# Patient Record
Sex: Male | Born: 1985 | Hispanic: Yes | Marital: Single | Smoking: Former smoker
Health system: Southern US, Community
[De-identification: ages and names within clinical notes are randomized; demographics above are authoritative.]

## PROBLEM LIST (undated history)

## (undated) ENCOUNTER — Ambulatory Visit (HOSPITAL_COMMUNITY): Admission: EM | Payer: 59

## (undated) DIAGNOSIS — E079 Disorder of thyroid, unspecified: Secondary | ICD-10-CM

## (undated) DIAGNOSIS — F32A Depression, unspecified: Secondary | ICD-10-CM

## (undated) DIAGNOSIS — I82409 Acute embolism and thrombosis of unspecified deep veins of unspecified lower extremity: Secondary | ICD-10-CM

## (undated) DIAGNOSIS — K589 Irritable bowel syndrome without diarrhea: Secondary | ICD-10-CM

## (undated) DIAGNOSIS — L97909 Non-pressure chronic ulcer of unspecified part of unspecified lower leg with unspecified severity: Secondary | ICD-10-CM

## (undated) DIAGNOSIS — K5900 Constipation, unspecified: Secondary | ICD-10-CM

## (undated) DIAGNOSIS — E291 Testicular hypofunction: Secondary | ICD-10-CM

## (undated) DIAGNOSIS — K769 Liver disease, unspecified: Secondary | ICD-10-CM

## (undated) DIAGNOSIS — I639 Cerebral infarction, unspecified: Secondary | ICD-10-CM

## (undated) DIAGNOSIS — N329 Bladder disorder, unspecified: Secondary | ICD-10-CM

## (undated) DIAGNOSIS — Z992 Dependence on renal dialysis: Secondary | ICD-10-CM

## (undated) DIAGNOSIS — M797 Fibromyalgia: Secondary | ICD-10-CM

## (undated) DIAGNOSIS — L989 Disorder of the skin and subcutaneous tissue, unspecified: Secondary | ICD-10-CM

## (undated) DIAGNOSIS — M810 Age-related osteoporosis without current pathological fracture: Secondary | ICD-10-CM

## (undated) DIAGNOSIS — F988 Other specified behavioral and emotional disorders with onset usually occurring in childhood and adolescence: Secondary | ICD-10-CM

## (undated) DIAGNOSIS — I10 Essential (primary) hypertension: Secondary | ICD-10-CM

## (undated) DIAGNOSIS — C801 Malignant (primary) neoplasm, unspecified: Secondary | ICD-10-CM

## (undated) DIAGNOSIS — R194 Change in bowel habit: Secondary | ICD-10-CM

## (undated) DIAGNOSIS — T7840XA Allergy, unspecified, initial encounter: Secondary | ICD-10-CM

## (undated) DIAGNOSIS — R569 Unspecified convulsions: Secondary | ICD-10-CM

## (undated) DIAGNOSIS — M2550 Pain in unspecified joint: Secondary | ICD-10-CM

## (undated) DIAGNOSIS — D699 Hemorrhagic condition, unspecified: Secondary | ICD-10-CM

## (undated) DIAGNOSIS — N529 Male erectile dysfunction, unspecified: Secondary | ICD-10-CM

## (undated) DIAGNOSIS — F419 Anxiety disorder, unspecified: Secondary | ICD-10-CM

## (undated) DIAGNOSIS — K219 Gastro-esophageal reflux disease without esophagitis: Secondary | ICD-10-CM

## (undated) DIAGNOSIS — K625 Hemorrhage of anus and rectum: Secondary | ICD-10-CM

## (undated) DIAGNOSIS — I519 Heart disease, unspecified: Secondary | ICD-10-CM

## (undated) DIAGNOSIS — J449 Chronic obstructive pulmonary disease, unspecified: Secondary | ICD-10-CM

## (undated) DIAGNOSIS — N200 Calculus of kidney: Secondary | ICD-10-CM

## (undated) DIAGNOSIS — E7800 Pure hypercholesterolemia, unspecified: Secondary | ICD-10-CM

## (undated) DIAGNOSIS — E669 Obesity, unspecified: Secondary | ICD-10-CM

## (undated) DIAGNOSIS — G4752 REM sleep behavior disorder: Secondary | ICD-10-CM

## (undated) DIAGNOSIS — M109 Gout, unspecified: Secondary | ICD-10-CM

## (undated) DIAGNOSIS — M1990 Unspecified osteoarthritis, unspecified site: Secondary | ICD-10-CM

## (undated) DIAGNOSIS — A159 Respiratory tuberculosis unspecified: Secondary | ICD-10-CM

## (undated) DIAGNOSIS — I2510 Atherosclerotic heart disease of native coronary artery without angina pectoris: Secondary | ICD-10-CM

## (undated) DIAGNOSIS — Z950 Presence of cardiac pacemaker: Secondary | ICD-10-CM

## (undated) DIAGNOSIS — E0590 Thyrotoxicosis, unspecified without thyrotoxic crisis or storm: Secondary | ICD-10-CM

## (undated) DIAGNOSIS — I219 Acute myocardial infarction, unspecified: Secondary | ICD-10-CM

## (undated) DIAGNOSIS — H9390 Unspecified disorder of ear, unspecified ear: Secondary | ICD-10-CM

## (undated) DIAGNOSIS — I2699 Other pulmonary embolism without acute cor pulmonale: Secondary | ICD-10-CM

## (undated) DIAGNOSIS — R1013 Epigastric pain: Secondary | ICD-10-CM

## (undated) DIAGNOSIS — G4733 Obstructive sleep apnea (adult) (pediatric): Secondary | ICD-10-CM

## (undated) DIAGNOSIS — K5792 Diverticulitis of intestine, part unspecified, without perforation or abscess without bleeding: Secondary | ICD-10-CM

## (undated) DIAGNOSIS — D649 Anemia, unspecified: Secondary | ICD-10-CM

## (undated) DIAGNOSIS — Q899 Congenital malformation, unspecified: Secondary | ICD-10-CM

## (undated) DIAGNOSIS — J45909 Unspecified asthma, uncomplicated: Secondary | ICD-10-CM

## (undated) DIAGNOSIS — E119 Type 2 diabetes mellitus without complications: Secondary | ICD-10-CM

## (undated) DIAGNOSIS — L309 Dermatitis, unspecified: Secondary | ICD-10-CM

## (undated) DIAGNOSIS — E039 Hypothyroidism, unspecified: Secondary | ICD-10-CM

## (undated) DIAGNOSIS — N289 Disorder of kidney and ureter, unspecified: Secondary | ICD-10-CM

## (undated) HISTORY — DX: Respiratory tuberculosis unspecified: A15.9

## (undated) HISTORY — DX: Disorder of kidney and ureter, unspecified: N28.9

## (undated) HISTORY — DX: Pure hypercholesterolemia, unspecified: E78.00

## (undated) HISTORY — DX: Disorder of thyroid, unspecified: E07.9

## (undated) HISTORY — DX: Other specified behavioral and emotional disorders with onset usually occurring in childhood and adolescence: F98.8

## (undated) HISTORY — DX: Anemia, unspecified: D64.9

## (undated) HISTORY — DX: Non-pressure chronic ulcer of unspecified part of unspecified lower leg with unspecified severity: L97.909

## (undated) HISTORY — DX: Diverticulitis of intestine, part unspecified, without perforation or abscess without bleeding: K57.92

## (undated) HISTORY — DX: Age-related osteoporosis without current pathological fracture: M81.0

## (undated) HISTORY — DX: Bladder disorder, unspecified: N32.9

## (undated) HISTORY — DX: Constipation, unspecified: K59.00

## (undated) HISTORY — DX: Epigastric pain: R10.13

## (undated) HISTORY — PX: INSERT / REPLACE / REMOVE PACEMAKER: SUR710

## (undated) HISTORY — DX: Malignant (primary) neoplasm, unspecified: C80.1

## (undated) HISTORY — DX: Unspecified disorder of ear, unspecified ear: H93.90

## (undated) HISTORY — DX: Gastro-esophageal reflux disease without esophagitis: K21.9

## (undated) HISTORY — DX: Allergy, unspecified, initial encounter: T78.40XA

## (undated) HISTORY — DX: Dermatitis, unspecified: L30.9

## (undated) HISTORY — DX: Thyrotoxicosis, unspecified without thyrotoxic crisis or storm: E05.90

## (undated) HISTORY — DX: Hemorrhage of anus and rectum: K62.5

## (undated) HISTORY — DX: Gout, unspecified: M10.9

## (undated) HISTORY — DX: Type 2 diabetes mellitus without complications: E11.9

## (undated) HISTORY — DX: Other pulmonary embolism without acute cor pulmonale: I26.99

## (undated) HISTORY — DX: Acute myocardial infarction, unspecified: I21.9

## (undated) HISTORY — DX: Atherosclerotic heart disease of native coronary artery without angina pectoris: I25.10

## (undated) HISTORY — DX: Irritable bowel syndrome, unspecified: K58.9

## (undated) HISTORY — DX: Calculus of kidney: N20.0

## (undated) HISTORY — DX: Unspecified convulsions: R56.9

## (undated) HISTORY — DX: Disorder of the skin and subcutaneous tissue, unspecified: L98.9

## (undated) HISTORY — DX: Essential (primary) hypertension: I10

## (undated) HISTORY — DX: Depression, unspecified: F32.A

## (undated) HISTORY — DX: Heart disease, unspecified: I51.9

## (undated) HISTORY — DX: Change in bowel habit: R19.4

## (undated) HISTORY — DX: Liver disease, unspecified: K76.9

## (undated) HISTORY — DX: Fibromyalgia: M79.7

## (undated) HISTORY — DX: Congenital malformation, unspecified: Q89.9

## (undated) HISTORY — DX: REM sleep behavior disorder: G47.52

## (undated) HISTORY — DX: Male erectile dysfunction, unspecified: N52.9

## (undated) HISTORY — DX: Hypothyroidism, unspecified: E03.9

## (undated) HISTORY — DX: Presence of cardiac pacemaker: Z95.0

## (undated) HISTORY — DX: Dependence on renal dialysis: Z99.2

## (undated) HISTORY — DX: Anxiety disorder, unspecified: F41.9

## (undated) HISTORY — DX: Obesity, unspecified: E66.9

## (undated) HISTORY — DX: Testicular hypofunction: E29.1

## (undated) HISTORY — DX: Obstructive sleep apnea (adult) (pediatric): G47.33

## (undated) HISTORY — DX: Hemorrhagic condition, unspecified: D69.9

## (undated) HISTORY — DX: Pain in unspecified joint: M25.50

## (undated) HISTORY — DX: Unspecified asthma, uncomplicated: J45.909

## (undated) HISTORY — DX: Cerebral infarction, unspecified: I63.9

## (undated) HISTORY — DX: Unspecified osteoarthritis, unspecified site: M19.90

## (undated) HISTORY — DX: Chronic obstructive pulmonary disease, unspecified: J44.9

## (undated) HISTORY — DX: Acute embolism and thrombosis of unspecified deep veins of unspecified lower extremity: I82.409

---

## 2010-03-12 HISTORY — PX: CHOLECYSTECTOMY: SHX55

## 2017-03-08 DIAGNOSIS — I10 Essential (primary) hypertension: Secondary | ICD-10-CM | POA: Insufficient documentation

## 2017-07-23 DIAGNOSIS — G4733 Obstructive sleep apnea (adult) (pediatric): Secondary | ICD-10-CM | POA: Insufficient documentation

## 2017-10-01 DIAGNOSIS — L304 Erythema intertrigo: Secondary | ICD-10-CM | POA: Insufficient documentation

## 2017-10-01 HISTORY — DX: Erythema intertrigo: L30.4

## 2017-10-20 NOTE — Progress Notes (Deleted)
   Subjective:    Patient ID: Leslie Andrea, male    DOB: 1985/05/19, 32 y.o.   MRN: [id number]  HPI:  Mr. Milano is here to establish as a new pt.  He is a pleasant 32 year old male. PMH:   Patient Care Team    Relationship Specialty Notifications Start End  Galateo, Berna Spare, NP PCP - General Family Medicine  09/24/17     There are no active problems to display for this patient.    No past medical history on file.   *** The histories are not reviewed yet. Please review them in the "History" navigator section and refresh this [place].   No family history on file.   Social History   Substance and Sexual Activity  Drug Use Not on file     Social History   Substance and Sexual Activity  Alcohol Use Not on file     Social History   Tobacco Use  Smoking Status Not on file     No outpatient encounter medications on file as of 10/21/2017.   No facility-administered encounter medications on file as of 10/21/2017.     Allergies: Patient has no allergy information on record.  There is no height or weight on file to calculate BMI.  There were no vitals taken for this visit.     Review of Systems     Objective:   Physical Exam        Assessment & Plan:  No diagnosis found.  No problem-specific Assessment & Plan notes found for this encounter.    FOLLOW-UP:  No follow-ups on file.

## 2017-10-21 ENCOUNTER — Ambulatory Visit: Payer: Self-pay | Admitting: Adult Health

## 2018-02-03 ENCOUNTER — Other Ambulatory Visit: Payer: Self-pay | Admitting: Nurse Practitioner

## 2018-02-03 ENCOUNTER — Ambulatory Visit
Admission: RE | Admit: 2018-02-03 | Discharge: 2018-02-03 | Disposition: A | Discharge status: Home / Self Care | Payer: No Typology Code available for payment source | Source: Ambulatory Visit | Attending: Nurse Practitioner | Admitting: Nurse Practitioner

## 2018-02-03 DIAGNOSIS — M5489 Other dorsalgia: Secondary | ICD-10-CM

## 2018-02-03 DIAGNOSIS — R5383 Other fatigue: Secondary | ICD-10-CM

## 2018-04-11 ENCOUNTER — Other Ambulatory Visit: Payer: Self-pay | Admitting: Nurse Practitioner

## 2018-04-11 DIAGNOSIS — M545 Low back pain, unspecified: Secondary | ICD-10-CM

## 2018-04-28 ENCOUNTER — Ambulatory Visit
Admission: RE | Admit: 2018-04-28 | Discharge: 2018-04-28 | Disposition: A | Discharge status: Home / Self Care | Payer: Self-pay | Source: Ambulatory Visit | Attending: Nurse Practitioner | Admitting: Nurse Practitioner

## 2018-04-28 DIAGNOSIS — M545 Low back pain, unspecified: Secondary | ICD-10-CM

## 2018-11-21 DIAGNOSIS — F419 Anxiety disorder, unspecified: Secondary | ICD-10-CM | POA: Insufficient documentation

## 2018-11-21 HISTORY — DX: Anxiety disorder, unspecified: F41.9

## 2019-06-03 DIAGNOSIS — F418 Other specified anxiety disorders: Secondary | ICD-10-CM | POA: Insufficient documentation

## 2019-06-03 HISTORY — DX: Other specified anxiety disorders: F41.8

## 2019-12-04 ENCOUNTER — Telehealth: Payer: Self-pay

## 2019-12-04 NOTE — Telephone Encounter (Signed)
NOTES ON FILE FROM Evalyn Casco NP [phone], SENT REFERRAL TO SCHEDULING

## 2019-12-10 NOTE — Telephone Encounter (Signed)
Received phone call from pt to schedule appt. Have not received new patient referral yet from [facility]

## 2019-12-31 ENCOUNTER — Other Ambulatory Visit: Payer: Self-pay

## 2019-12-31 ENCOUNTER — Telehealth: Payer: Self-pay | Admitting: Radiology

## 2019-12-31 ENCOUNTER — Ambulatory Visit (INDEPENDENT_AMBULATORY_CARE_PROVIDER_SITE_OTHER): Payer: Self-pay | Admitting: Internal Medicine

## 2019-12-31 ENCOUNTER — Encounter: Payer: Self-pay | Admitting: Internal Medicine

## 2019-12-31 VITALS — BP 118/62 | HR 76 | Ht 68.0 in | Wt >= 6400 oz

## 2019-12-31 DIAGNOSIS — E785 Hyperlipidemia, unspecified: Secondary | ICD-10-CM | POA: Insufficient documentation

## 2019-12-31 DIAGNOSIS — E7849 Other hyperlipidemia: Secondary | ICD-10-CM | POA: Insufficient documentation

## 2019-12-31 DIAGNOSIS — Z720 Tobacco use: Secondary | ICD-10-CM

## 2019-12-31 DIAGNOSIS — R002 Palpitations: Secondary | ICD-10-CM | POA: Insufficient documentation

## 2019-12-31 HISTORY — DX: Tobacco use: Z72.0

## 2019-12-31 NOTE — Patient Instructions (Signed)
Medication Instructions:  Your physician recommends that you continue on your current medications as directed. Please refer to the Current Medication list given to you today.  *If you need a refill on your cardiac medications before your next appointment, please call your pharmacy*   Lab Work: none If you have labs (blood work) drawn today and your tests are completely normal, you will receive your results only by: Marland Kitchen MyChart Message (if you have MyChart) OR . A paper copy in the mail If you have any lab test that is abnormal or we need to change your treatment, we will call you to review the results.   Testing/Procedures: Your physician has recommended that you wear a holter monitor. Holter monitors are medical devices that record the heart's electrical activity. Doctors most often use these monitors to diagnose arrhythmias. Arrhythmias are problems with the speed or rhythm of the heartbeat. The monitor is a small, portable device. You can wear one while you do your normal daily activities. This is usually used to diagnose what is causing palpitations/syncope (passing out).  14 day zio     Follow-Up: At [facility], you and your health needs are our priority.  As part of our continuing mission to provide you with exceptional heart care, we have created designated Provider Care Teams.  These Care Teams include your primary Cardiologist (physician) and Advanced Practice Providers (APPs -  Physician Assistants and Nurse Practitioners) who all work together to provide you with the care you need, when you need it.  We recommend signing up for the patient portal called "MyChart".  Sign up information is provided on this After Visit Summary.  MyChart is used to connect with patients for Virtual Visits (Telemedicine).  Patients are able to view lab/test results, encounter notes, upcoming appointments, etc.  Non-urgent messages can be sent to your provider as well.   To learn more about what you  can do with MyChart, go to [email or web address].    Your next appointment:   3-4} month(s)   The format for your next appointment:   In Person  Provider:   You may see Werner Lean, MD or one of the following Advanced Practice Providers on your designated Care Team:    Melina Copa, PA-C  Ermalinda Barrios, PA-C    Other Instructions  Bryn Gulling- Long Term Monitor Instructions   Your physician has requested you wear your ZIO patch monitor_______days.   This is a single patch monitor.  [employer] supplies one patch monitor per enrollment.  Additional stickers are not available.   Please do not apply patch if you will be having a Nuclear Stress Test, Echocardiogram, Cardiac CT, MRI, or Chest Xray during the time frame you would be wearing the monitor. The patch cannot be worn during these tests.  You cannot remove and re-apply the ZIO XT patch monitor.   Your ZIO patch monitor will be sent USPS Priority mail from [facility] directly to your home address. The monitor may also be mailed to a PO BOX if home delivery is not available.   It may take 3-5 days to receive your monitor after you have been enrolled.   Once you have received you monitor, please review enclosed instructions.  Your monitor has already been registered assigning a specific monitor serial # to you.   Applying the monitor   Shave hair from upper left chest.   Hold abrader disc by orange tab.  Rub abrader in 40 strokes over left upper chest as  indicated in your monitor instructions.   Clean area with 4 enclosed alcohol pads .  Use all pads to assure are is cleaned thoroughly.  Let dry.   Apply patch as indicated in monitor instructions.  Patch will be place under collarbone on left side of chest with arrow pointing upward.   Rub patch adhesive wings for 2 minutes.Remove white label marked "1".  Remove white label marked "2".  Rub patch adhesive wings for 2 additional minutes.   While looking in a  mirror, press and release button in center of patch.  A small green light will flash 3-4 times .  This will be your only indicator the monitor has been turned on.     Do not shower for the first 24 hours.  You may shower after the first 24 hours.   Press button if you feel a symptom. You will hear a small click.  Record Date, Time and Symptom in the Patient Log Book.   When you are ready to remove patch, follow instructions on last 2 pages of Patient Log Book.  Stick patch monitor onto last page of Patient Log Book.   Place Patient Log Book in [place] box.  Use locking tab on box and tape box closed securely.  The [employer] and [employer] has [employer] on it.  Please place in mailbox as soon as possible.  Your physician should have your test results approximately 7 days after the monitor has been mailed back to [facility].   Call [place] at [phone] if you have questions regarding your ZIO XT patch monitor.  Call them immediately if you see an orange light blinking on your monitor.   If your monitor falls off in less than 4 days contact our Monitor department at [phone].  If your monitor becomes loose or falls off after 4 days call [employer] at [phone] for suggestions on securing your monitor.

## 2019-12-31 NOTE — Telephone Encounter (Signed)
Enrolled patient for a 14 day Zio XT Monitor to be mailed to patients home  

## 2019-12-31 NOTE — Progress Notes (Signed)
Cardiology Office Note:    Date:  12/31/2019   ID:  Eddie Murray, DOB 06/21/1985, MRN [id number]  Referring MD: Finis Bud, NP   CC: weird symptoms with GERD Consulted for the evaluation of palpitations at the behest of [place], [place], [place]  History of Present Illness:    Eddie Murray is a 34 y.o. male with a hx of HTN, GERD, Tobacco Abuse, HTN, HLD Morbid obesity, OSA, . who presents for palpitations.  Saw his PCP with occasional palpitations, and shortness of breath with sitting doewn.  Patient notes that he went down to [place] in the setting of some family problems.  Notes that he also had changed SSRIs.  Felt palpitations at rest.  Felt his heart beat more.  No chest pain.  No syncope, no near syncope.  Last time he felt the palpitations between 8 am and noon.  Has it 5/7 days.  Only occurs when sitting.  Not associated standing.  Notes that he has been having very difficult to control GERD and had recent endoscopy.  Thinks he may have esophageal spasm.  Has feeling of food getting stuck.  Had a stress test done in 2011 that was normal.  Past Medical History:  Diagnosis Date  . ADD (attention deficit disorder)   . Allergies   . Anemia   . Anxiety disorder   . Arthritis   . Asthma   . Bladder problem   . Bleeding disorder ([place])   . CAD (coronary artery disease)   . Cancer ([place])   . Congenital abnormalities   . Constipation   . COPD (chronic obstructive pulmonary disease) ([place])   . Depression   . Diabetes ([place])   . Dialysis patient ([place])   . Diverticulitis   . DVT (deep venous thrombosis) ([place])   . Ear problems   . Eczema   . ED (erectile dysfunction)   . Fibromyalgia   . GERD (gastroesophageal reflux disease)   . Gout   . Heart attack ([place])   . Heart disease   . High cholesterol   . Hypertension   . Hyperthyroidism   . Hypogonadism in male   . Hypothyroidism   . Joint pain   . Kidney disease   . Kidney stone   . Leg ulcer ([place])   . Liver  disease   . Obesity   . OSA (obstructive sleep apnea)   . Osteoporosis   . Pacemaker   . Pulmonary embolism ([place])   . REM behavioral disorder   . Seizures ([place])   . Skin disorder   . Stroke ([place])   . Thyroid disorder   . Tuberculosis     Past Surgical History:  Procedure Laterality Date  . INSERT / REPLACE / REMOVE PACEMAKER      Current Medications: Current Meds  Medication Sig  . amLODipine (NORVASC) 10 MG tablet Take 10 mg by mouth daily.  Marland Kitchen buPROPion (WELLBUTRIN SR) 150 MG 12 hr tablet Take 150 mg by mouth daily.  . cetirizine (ZYRTEC) 10 MG tablet Take 10 mg by mouth as needed for allergies.   . clonazePAM (KLONOPIN) 0.5 MG tablet Take 0.5 mg by mouth daily as needed for anxiety.  Marland Kitchen GARLIC PO Take by mouth.  Marland Kitchen ibuprofen (ADVIL) 600 MG tablet Take 600 mg by mouth every 6 (six) hours as needed.  Marland Kitchen omeprazole (PRILOSEC) 40 MG capsule Take 40 mg by mouth daily.    Allergies:   Lisinopril   Social History  Socioeconomic History  . Marital status: Single    Spouse name: Not on file  . Number of children: Not on file  . Years of education: Not on file  . Highest education level: Not on file  Occupational History  . Not on file  Tobacco Use  . Smoking status: Former Smoker    Types: Cigarettes, Cigars    Quit date: 2017    Years since quitting: 4.8  . Smokeless tobacco: Never Used  Substance and Sexual Activity  . Alcohol use: Not on file  . Drug use: Not on file  . Sexual activity: Not on file  Other Topics Concern  . Not on file  Social History Narrative  . Not on file   Social Determinants of Health   Financial Resource Strain:   . Difficulty of Paying Living Expenses: Not on file  Food Insecurity:   . Worried About Charity fundraiser in the Last Year: Not on file  . Ran Out of Food in the Last Year: Not on file  Transportation Needs:   . Lack of Transportation (Medical): Not on file  . Lack of Transportation (Non-Medical): Not on file  Physical  Activity:   . Days of Exercise per Week: Not on file  . Minutes of Exercise per Session: Not on file  Stress:   . Feeling of Stress : Not on file  Social Connections:   . Frequency of Communication with Friends and Family: Not on file  . Frequency of Social Gatherings with Friends and Family: Not on file  . Attends Religious Services: Not on file  . Active Member of Clubs or Organizations: Not on file  . Attends Archivist Meetings: Not on file  . Marital Status: Not on file    Family History: The patient's family history includes Diabetes in his father; Hypertension in his father and mother; Other in his maternal grandfather; Thyroid disease in his maternal grandfather and mother. No family history of early CAD  ROS:   Please see the history of present illness.    All other systems reviewed and are negative.  EKGs/Labs/Other Studies Reviewed:    The following studies were reviewed today:  EKG:  EKG is ordered today.  The ekg ordered today demonstrates a regular rhythm; likely sinus with low voltage p waves, rate 75 no ST/T changes   Physical Exam:    VS:  BP 118/62   Pulse 76   Ht 5\' 8"  (1.727 m)   Wt (!) 418 lb (189.6 kg)   SpO2 97%   BMI 63.56 kg/m     Wt Readings from Last 3 Encounters:  12/31/19 (!) 418 lb (189.6 kg)    GEN: Morbidly Obese well developed in no acute distress HEENT: Normal NECK: No JVD; No carotid bruits LYMPHATICS: No lymphadenopathy CARDIAC: RRR, no murmurs, rubs, gallops RESPIRATORY:  Clear to auscultation without rales, wheezing or rhonchi  ABDOMEN: Soft, non-tender, non-distended MUSCULOSKELETAL:  No edema; No deformity  SKIN: Warm and dry NEUROLOGIC:  Alert and oriented x 3 PSYCHIATRIC:  Normal affect   ASSESSMENT:    No diagnosis found.  PLAN:    In order of problems listed above:  Palpitations - in the setting of Morbid Obesity HTN OSA - Will get 14 day non-live Ziopatch.  HLD - patient deferred further eval at  this time; does not have insurance and would like to address with PCP  Tobacco Abuse- occasional cigars; on buproprion with GERD 1. The patient was counseled  on the dangers of tobacco use, both inhaled and oral, which include, but are not limited to cardiovascular disease, increased cancer risk of multiple types of cancer, COPD, peripheral vascular disease, strokes. 2. He was also counseled on the benefits of smoking cessation. 3. The patient was firmly advised to quit.    4. We also reviewed strategies to maximize success, including:  Removing cigarettes and smoking materials from environment  Stress management  Substitution of other forms of reinforcement Support of family/friends.  Selecting a quit date.  Patient provided contact information for 1-800-QUIT-NOW      3-4 month follow up after testing unless new symptoms or abnormal test results warranting change in plan  Would be reasonable for Virtual Follow up  Would be reasonable for APP Follow up   Medication Adjustments/Labs and Tests Ordered: Current medicines are reviewed at length with the patient today.  Concerns regarding medicines are outlined above.  No orders of the defined types were placed in this encounter.  No orders of the defined types were placed in this encounter.   There are no Patient Instructions on file for this visit.   Signed, Werner Lean, MD  12/31/2019 8:59 AM    [facility]

## 2020-01-04 ENCOUNTER — Other Ambulatory Visit (INDEPENDENT_AMBULATORY_CARE_PROVIDER_SITE_OTHER): Payer: Self-pay

## 2020-01-04 DIAGNOSIS — R002 Palpitations: Secondary | ICD-10-CM

## 2020-01-13 DIAGNOSIS — K0510 Chronic gingivitis, plaque induced: Secondary | ICD-10-CM

## 2020-01-13 DIAGNOSIS — K219 Gastro-esophageal reflux disease without esophagitis: Secondary | ICD-10-CM

## 2020-01-13 HISTORY — DX: Gastro-esophageal reflux disease without esophagitis: K21.9

## 2020-01-13 HISTORY — DX: Chronic gingivitis, plaque induced: K05.10

## 2020-04-04 ENCOUNTER — Encounter: Payer: Self-pay | Admitting: Internal Medicine

## 2020-04-04 ENCOUNTER — Other Ambulatory Visit: Payer: Self-pay

## 2020-04-04 ENCOUNTER — Ambulatory Visit (INDEPENDENT_AMBULATORY_CARE_PROVIDER_SITE_OTHER): Payer: 59 | Admitting: Internal Medicine

## 2020-04-04 VITALS — BP 118/80 | HR 80 | Ht 68.0 in | Wt >= 6400 oz

## 2020-04-04 DIAGNOSIS — R0600 Dyspnea, unspecified: Secondary | ICD-10-CM

## 2020-04-04 DIAGNOSIS — R002 Palpitations: Secondary | ICD-10-CM

## 2020-04-04 DIAGNOSIS — R0609 Other forms of dyspnea: Secondary | ICD-10-CM | POA: Insufficient documentation

## 2020-04-04 DIAGNOSIS — E785 Hyperlipidemia, unspecified: Secondary | ICD-10-CM | POA: Diagnosis not present

## 2020-04-04 DIAGNOSIS — Z6841 Body Mass Index (BMI) 40.0 and over, adult: Secondary | ICD-10-CM | POA: Insufficient documentation

## 2020-04-04 DIAGNOSIS — R079 Chest pain, unspecified: Secondary | ICD-10-CM | POA: Diagnosis not present

## 2020-04-04 DIAGNOSIS — Z720 Tobacco use: Secondary | ICD-10-CM

## 2020-04-04 DIAGNOSIS — I10 Essential (primary) hypertension: Secondary | ICD-10-CM

## 2020-04-04 HISTORY — DX: Other forms of dyspnea: R06.09

## 2020-04-04 HISTORY — DX: Morbid (severe) obesity due to excess calories: E66.01

## 2020-04-04 HISTORY — DX: Chest pain, unspecified: R07.9

## 2020-04-04 NOTE — Patient Instructions (Addendum)
Medication Instructions:  Your physician recommends that you continue on your current medications as directed. Please refer to the Current Medication list given to you today.  *If you need a refill on your cardiac medications before your next appointment, please call your pharmacy*   Lab Work: None ordered   If you have labs (blood work) drawn today and your tests are completely normal, you will receive your results only by: Marland Kitchen MyChart Message (if you have MyChart) OR . A paper copy in the mail If you have any lab test that is abnormal or we need to change your treatment, we will call you to review the results.   Testing/Procedures: Your physician has requested that you have an echocardiogram. Echocardiography is a painless test that uses sound waves to create images of your heart. It provides your doctor with information about the size and shape of your heart and how well your heart's chambers and valves are working. This procedure takes approximately one hour. There are no restrictions for this procedure.   Follow-Up: At [facility], you and your health needs are our priority.  As part of our continuing mission to provide you with exceptional heart care, we have created designated Provider Care Teams.  These Care Teams include your primary Cardiologist (physician) and Advanced Practice Providers (APPs -  Physician Assistants and Nurse Practitioners) who all work together to provide you with the care you need, when you need it.  We recommend signing up for the patient portal called "MyChart".  Sign up information is provided on this After Visit Summary.  MyChart is used to connect with patients for Virtual Visits (Telemedicine).  Patients are able to view lab/test results, encounter notes, upcoming appointments, etc.  Non-urgent messages can be sent to your provider as well.   To learn more about what you can do with MyChart, go to [email or web address].    Your next appointment:   6  month(s)  The format for your next appointment:   In Person  Provider:   You may see Werner Lean, MD or one of the following Advanced Practice Providers on your designated Care Team:    Melina Copa, PA-C  Ermalinda Barrios, PA-C    Other Instructions Your physician has referred you to see [facility]

## 2020-04-04 NOTE — Progress Notes (Signed)
Cardiology Office Note:    Date:  04/04/2020   ID:  Eddie Murray, DOB [birth year], MRN [id number]  Referring MD: Chaney Malling, PA   CC: weird symptoms with GERD Consulted for the evaluation of palpitations at the [facility] [place], [place], [place]  History of Present Illness:    Eddie Murray is a 35 y.o. male with a hx of HTN, Morbid Obesity with HLD & OSA, GERD, Tobacco Abuse, SA, . who presented for palpitations 12/31/19.  Had event monitor that showed that most of his symptoms were associated with sinus rhythm.  Patient notes that he is doing [place].  Since last visit notes that he has tried to cut gluten down and notes some improvement.  Relevant interval testing or therapy include new muscle relaxant start for his arm pain (see below).  There are no interval hospital/ED visit.    No chest pain or pressure.  Has had new left arm pain:  Forearm pain that shoots up his arm.  This occurs constantly with no change with activity.  Can tell when he wakes up if its going to be a good or bad today.  Depends on sleeping position.  Notes that when his spells occur he has some shortness of breath and , if they occur when he is exerting himself, has DOE.  Still has palpitations.  Ambulatory blood pressure SBP 130-135.   Past Medical History:  Diagnosis Date  . ADD (attention deficit disorder)   . Allergies   . Anemia   . Anxiety disorder   . Arthritis   . Asthma   . Bladder problem   . Bleeding disorder ([place])   . CAD (coronary artery disease)   . Cancer ([place])   . Congenital abnormalities   . Constipation   . COPD (chronic obstructive pulmonary disease) ([place])   . Depression   . Diabetes ([place])   . Dialysis patient ([place])   . Diverticulitis   . DVT (deep venous thrombosis) ([place])   . Ear problems   . Eczema   . ED (erectile dysfunction)   . Fibromyalgia   . GERD (gastroesophageal reflux disease)   . Gout   . Heart attack ([place])   . Heart disease   . High cholesterol   . Hypertension    . Hyperthyroidism   . Hypogonadism in male   . Hypothyroidism   . Joint pain   . Kidney disease   . Kidney stone   . Leg ulcer ([place])   . Liver disease   . Obesity   . OSA (obstructive sleep apnea)   . Osteoporosis   . Pacemaker   . Pulmonary embolism ([place])   . REM behavioral disorder   . Seizures ([place])   . Skin disorder   . Stroke ([place])   . Thyroid disorder   . Tuberculosis     Past Surgical History:  Procedure Laterality Date  . INSERT / REPLACE / REMOVE PACEMAKER      Current Medications: Current Meds  Medication Sig  . amLODipine (NORVASC) 10 MG tablet Take 10 mg by mouth daily.  Marland Kitchen buPROPion (WELLBUTRIN SR) 150 MG 12 hr tablet Take 150 mg by mouth daily.  . cetirizine (ZYRTEC) 10 MG tablet Take 10 mg by mouth as needed for allergies.   . clonazePAM (KLONOPIN) 0.5 MG tablet Take 0.5 mg by mouth daily as needed for anxiety.  . cyclobenzaprine (FLEXERIL) 10 MG tablet Take 10 mg by mouth 3 (three) times daily.  Marland Kitchen GARLIC PO  Take by mouth as needed.  . hydrochlorothiazide (HYDRODIURIL) 12.5 MG tablet Take 12.5 mg by mouth 3 times/day as needed-between meals & bedtime (BP).  Marland Kitchen ibuprofen (ADVIL) 600 MG tablet Take 600 mg by mouth every 6 (six) hours as needed for headache or moderate pain.  Marland Kitchen omeprazole (PRILOSEC) 40 MG capsule Take 40 mg by mouth as needed (heartburn).    Allergies:   Lisinopril   Social History   Socioeconomic History  . Marital status: Single    Spouse name: Not on file  . Number of children: Not on file  . Years of education: Not on file  . Highest education level: Not on file  Occupational History  . Not on file  Tobacco Use  . Smoking status: Former Smoker    Types: Cigarettes, Cigars    Quit date: 2017    Years since quitting: 5.0  . Smokeless tobacco: Never Used  Substance and Sexual Activity  . Alcohol use: Not on file  . Drug use: Not on file  . Sexual activity: Not on file  Other Topics Concern  . Not on file  Social History  Narrative  . Not on file   Social Determinants of Health   Financial Resource Strain: Not on file  Food Insecurity: Not on file  Transportation Needs: Not on file  Physical Activity: Not on file  Stress: Not on file  Social Connections: Not on file    Family History: The patient's family history includes Diabetes in his father; Hypertension in his father and mother; Other in his maternal grandfather; Thyroid disease in his maternal grandfather and mother. No family history of early CAD  ROS:   Please see the history of present illness.    All other systems reviewed and are negative.  EKGs/Labs/Other Studies Reviewed:    The following studies were reviewed today:  EKG:   12/31/2019 SR with low voltage p waves, rate 75 no ST/T changes  Physical Exam:    VS:  BP 118/80   Pulse 80   Ht 5\' 8"  (1.727 m)   Wt (!) 416 lb 6.4 oz (188.9 kg)   SpO2 98%   BMI 63.31 kg/m     Wt Readings from Last 3 Encounters:  04/04/20 (!) 416 lb 6.4 oz (188.9 kg)  12/31/19 (!) 418 lb (189.6 kg)    GEN: Morbidly Obese well developed in no acute distress HEENT: Normal NECK: No JVD; No carotid bruits LYMPHATICS: No lymphadenopathy CARDIAC: RRR, no murmurs, rubs, gallops (distant heart sounds) RESPIRATORY:  Clear to auscultation without rales, wheezing or rhonchi  ABDOMEN: Soft, non-tender, non-distended MUSCULOSKELETAL:  No edema; No deformity  SKIN: Warm and dry NEUROLOGIC:  Alert and oriented x 3 PSYCHIATRIC:  Normal affect   ASSESSMENT:    1. Palpitations   2. Chest pain of uncertain etiology   3. DOE (dyspnea on exertion)   4. Hyperlipidemia, unspecified hyperlipidemia type   5. Essential hypertension   6. Tobacco abuse   7. Morbid obesity (HCC)     PLAN:    In order of problems listed above:  Palpitations PACs/PVCs - discussed the risks and benefits of AV nodal therapy, given that 140+ of his 150 triggers are not PVC related, will defer trial of propranolol 80 mg PO daily    DOE with chest discomfort - will get echocardiogram - this DOE occurs with certain foods and unrelieved by PPI; reasonable to refer to GI  Essential Hypertension  Morbid Obesity  ambulatory blood pressure SBP  130, will continue ambulatory BP monitoring; gave education on how to perform ambulatory blood pressure monitoring including the frequency and technique; goal ambulatory blood pressure < 135/85 on average - continue home medications (HCTZ and novasc) - discussed diet (DASH/low sodium), and exercise/weight loss interventions   HLD - patient deferred further eval at this time; does not have insurance and would like to address with PCP; this has not changed from prior  Tobacco Abuse- occasional cigars; on buproprion 1. The patient was counseled on the dangers of tobacco use, both inhaled and oral, which include, but are not limited to cardiovascular disease, increased cancer risk of multiple types of cancer, COPD, peripheral vascular disease, strokes. 2. He was also counseled on the benefits of smoking cessation. 3. The patient was firmly advised to quit.    4. We also reviewed strategies to maximize success, including:  Removing cigarettes and smoking materials from environment  Stress management  Substitution of other forms of reinforcement Support of family/friends.  Selecting a quit date.  Patient provided contact information for 1-800-QUIT-NOW   6 month follow up after testing unless new symptoms or abnormal test results warranting change in plan  Would be reasonable for Virtual Follow up Would be reasonable for APP Follow up   Medication Adjustments/Labs and Tests Ordered: Current medicines are reviewed at length with the patient today.  Concerns regarding medicines are outlined above.  Orders Placed This Encounter  Procedures  . Ambulatory referral to Gastroenterology  . ECHOCARDIOGRAM COMPLETE   No orders of the defined types were placed in this  encounter.   Patient Instructions  Medication Instructions:  Your physician recommends that you continue on your current medications as directed. Please refer to the Current Medication list given to you today.  *If you need a refill on your cardiac medications before your next appointment, please call your pharmacy*   Lab Work: None ordered   If you have labs (blood work) drawn today and your tests are completely normal, you will receive your results only by: Marland Kitchen MyChart Message (if you have MyChart) OR . A paper copy in the mail If you have any lab test that is abnormal or we need to change your treatment, we will call you to review the results.   Testing/Procedures: Your physician has requested that you have an echocardiogram. Echocardiography is a painless test that uses sound waves to create images of your heart. It provides your doctor with information about the size and shape of your heart and how well your heart's chambers and valves are working. This procedure takes approximately one hour. There are no restrictions for this procedure.   Follow-Up: At [facility], you and your health needs are our priority.  As part of our continuing mission to provide you with exceptional heart care, we have created designated Provider Care Teams.  These Care Teams include your primary Cardiologist (physician) and Advanced Practice Providers (APPs -  Physician Assistants and Nurse Practitioners) who all work together to provide you with the care you need, when you need it.  We recommend signing up for the patient portal called "MyChart".  Sign up information is provided on this After Visit Summary.  MyChart is used to connect with patients for Virtual Visits (Telemedicine).  Patients are able to view lab/test results, encounter notes, upcoming appointments, etc.  Non-urgent messages can be sent to your provider as well.   To learn more about what you can do with MyChart, go to  [email or web address].  Your next appointment:   6 month(s)  The format for your next appointment:   In Person  Provider:   You may see Werner Lean, MD or one of the following Advanced Practice Providers on your designated Care Team:    Melina Copa, PA-C  Ermalinda Barrios, PA-C    Other Instructions Your physician has referred you to see [facility]     Signed, Werner Lean, MD  04/04/2020 9:11 AM    Eddie Murray

## 2020-04-06 ENCOUNTER — Other Ambulatory Visit: Payer: Self-pay

## 2020-04-06 ENCOUNTER — Ambulatory Visit
Admission: RE | Admit: 2020-04-06 | Discharge: 2020-04-06 | Disposition: A | Discharge status: Home / Self Care | Payer: 59 | Source: Ambulatory Visit | Attending: Physician Assistant | Admitting: Physician Assistant

## 2020-04-06 ENCOUNTER — Other Ambulatory Visit: Payer: Self-pay | Admitting: Physician Assistant

## 2020-04-06 DIAGNOSIS — M542 Cervicalgia: Secondary | ICD-10-CM

## 2020-04-26 ENCOUNTER — Ambulatory Visit (HOSPITAL_COMMUNITY): Payer: 59 | Attending: Cardiovascular Disease

## 2020-04-26 ENCOUNTER — Other Ambulatory Visit: Payer: Self-pay

## 2020-04-26 DIAGNOSIS — R0600 Dyspnea, unspecified: Secondary | ICD-10-CM | POA: Diagnosis not present

## 2020-04-26 DIAGNOSIS — R0609 Other forms of dyspnea: Secondary | ICD-10-CM

## 2020-04-26 LAB — ECHOCARDIOGRAM COMPLETE
Area-P 1/2: 3.99 cm2
S' Lateral: 3 cm

## 2020-04-26 MED ORDER — PERFLUTREN LIPID MICROSPHERE
1.0000 mL | INTRAVENOUS | Status: AC | PRN
  Administered 2020-04-26: 3 mL via INTRAVENOUS

## 2020-08-29 ENCOUNTER — Other Ambulatory Visit: Payer: Self-pay | Admitting: Urgent Care

## 2020-08-29 DIAGNOSIS — M25561 Pain in right knee: Secondary | ICD-10-CM

## 2020-09-10 ENCOUNTER — Ambulatory Visit
Admission: RE | Admit: 2020-09-10 | Discharge: 2020-09-10 | Disposition: A | Discharge status: Home / Self Care | Payer: 59 | Source: Ambulatory Visit | Attending: Urgent Care | Admitting: Urgent Care

## 2020-09-10 DIAGNOSIS — M25561 Pain in right knee: Secondary | ICD-10-CM

## 2020-10-13 ENCOUNTER — Telehealth: Payer: Self-pay | Admitting: Internal Medicine

## 2020-10-13 NOTE — Telephone Encounter (Signed)
Good afternoon Dr. Carlean Purl, we received a referral for patient to be seen for constipation.  Patient was previously going to [facility] but now has other medical insurance that they no longer participate with.  As the DOD, can you please review records in Epic and advise on scheduling?  Thank you.

## 2020-10-17 NOTE — Telephone Encounter (Signed)
We can schedule a new patient evaluation for this patient, first available App or physician  Would be a good 1 to put on Dr. Libby Maw schedule if she has openings

## 2020-10-19 NOTE — Telephone Encounter (Signed)
Patient scheduled for 11/16/2020 with an APP.

## 2020-10-31 ENCOUNTER — Other Ambulatory Visit: Payer: Self-pay | Admitting: Urgent Care

## 2020-10-31 DIAGNOSIS — M25512 Pain in left shoulder: Secondary | ICD-10-CM

## 2020-11-07 ENCOUNTER — Other Ambulatory Visit: Payer: Self-pay

## 2020-11-07 ENCOUNTER — Ambulatory Visit: Payer: 59 | Attending: Audiologist | Admitting: Audiologist

## 2020-11-07 DIAGNOSIS — H9313 Tinnitus, bilateral: Secondary | ICD-10-CM | POA: Diagnosis not present

## 2020-11-07 NOTE — Procedures (Signed)
  Outpatient Audiology and [place], [place]  [id number] [phone]  AUDIOLOGICAL  EVALUATION  NAME: Eddie Murray     DOB:   05-May-1985      MRN: [id number]                                                                                     DATE: 11/07/2020     REFERENT: Chaney Malling, PA STATUS: Outpatient DIAGNOSIS: Tinnitus    History: [name] was seen for an audiological evaluation.  [name] is receiving a hearing evaluation due to concerns for intermittent tinnitus and decreased hearing. [name] has episodes of tinnitus that occur a few days a week and sometimes last for several days. He is not hearing it now. This difficulty began suddenly in 2019 when [name] suddenly had numbness along the left side of his body and GI issues. The tinnitus started happening then, more often in his left ear. 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 He said the ringing often occurs with the TMJ tension. He will also get headaches at the same time. He has seen several specialists including cardiology and gastroenterology. No pain or pressure reported in either ear.  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  [name] has not received any diagnosis for the cause of his symptoms. No other relevant case history reported.    Evaluation:  Otoscopy showed a partial view of the tympanic membranes with cerumen present, bilaterally Tympanometry results were consistent with normal middle ear function, bilaterally   Audiometric testing was completed using conventional audiometry with insert transducer. Speech Recognition Thresholds were consistent with pure tone averages. Word Recognition was excellent at conversation level. Pure tone thresholds show normal hearing in both ears. Test results are consistent with normal hearing.   Results:  The test results were  reviewed with [name]. He has normal hearing in both ears. The hearing is the same in both ears. There is no indication of a hearing loss that could be causing the tinnitus. Tinnitus can be a common symptom of TMJ. We discussed masking strategies for times when tinnitus is bothersome.    Recommendations: No further audiologic testing is needed unless future hearing concerns arise. [name] is being followed by a variety of specialists for his symptoms. [name]'s tinnitus is likely a symptom of the larger undiagnosed syndrome he is experiencing.  Keep using eardrops only as recommended for cerumen management. However these drops will not alleviate tinnitus symptoms.  Wear headphones at less than 60% volume and for less than 60 minutes at a time.    [name]  Audiologist, Au.D., CCC-A 11/07/2020  10:25 AM  Cc: Chaney Malling, PA

## 2020-11-13 ENCOUNTER — Ambulatory Visit
Admission: RE | Admit: 2020-11-13 | Discharge: 2020-11-13 | Disposition: A | Discharge status: Home / Self Care | Payer: 59 | Source: Ambulatory Visit | Attending: Urgent Care | Admitting: Urgent Care

## 2020-11-13 ENCOUNTER — Other Ambulatory Visit: Payer: Self-pay

## 2020-11-13 DIAGNOSIS — M25512 Pain in left shoulder: Secondary | ICD-10-CM

## 2020-11-16 ENCOUNTER — Encounter: Payer: Self-pay | Admitting: Physician Assistant

## 2020-11-16 ENCOUNTER — Ambulatory Visit (INDEPENDENT_AMBULATORY_CARE_PROVIDER_SITE_OTHER): Payer: 59 | Admitting: Physician Assistant

## 2020-11-16 VITALS — BP 120/80 | HR 70 | Ht 68.0 in | Wt 389.0 lb

## 2020-11-16 DIAGNOSIS — R194 Change in bowel habit: Secondary | ICD-10-CM | POA: Diagnosis not present

## 2020-11-16 DIAGNOSIS — K5900 Constipation, unspecified: Secondary | ICD-10-CM

## 2020-11-16 DIAGNOSIS — K625 Hemorrhage of anus and rectum: Secondary | ICD-10-CM | POA: Diagnosis not present

## 2020-11-16 DIAGNOSIS — R1013 Epigastric pain: Secondary | ICD-10-CM

## 2020-11-16 NOTE — Progress Notes (Signed)
Chief Complaint: Change in bowel habits, abdominal spasms, reflux, rectal bleeding  HPI:    Eddie Murray is a 35 year old male with a past medical history of anxiety, CAD (04/26/2020 echo with a normal LVEF 60-65%), COPD, depression, diabetes, DVT, GERD and multiple others listed below, who was referred to me by Chaney Malling, PA for a complaint of change in bowel habits, abdominal spasms, reflux and rectal bleeding.    2019 EGD with mild reflux esophagitis, biopsies negative for Barrett's esophagus and eosinophilic esophagitis.  Biopsies from the small bowel negative for celiac disease.    04/11/2020 patient seen by digestive health for abdominal pain, cramping, gas and spasms.  At that time discussed similar symptoms in 2019.  Apparently underwent GI evaluation in [facility].  At that time discussed being started on sertraline at some point for his anxiety but this caused weight gain so he stopped it.  At that time discussed a lot of gas, bloating and discomfort.  At that time H. pylori antibody was ordered.  He was instructed to take Omeprazole 40 mg daily before breakfast.  Also recommend he discuss restarting Sertraline with his primary care doctor.    Today, the patient presents to clinic and explains that in July 2019 he started with a burning epigastric pain that radiated all down the left side of his body, he followed with a GI physician at that time and eventually had an EGD which he was told showed some inflammation.  Describes that the only thing they did was "put me on Omeprazole", he tells me "that is not my problem", and he just changed the way he was eating in his diet and seem to get rid of most of his reflux issues.  Tells me that currently he has issues because he can "hear my digestion", tells me he can feel things move slowly through his bowel which is often associated with a lot of generalized abdominal cramping pain and gas.  Had some pain in the middle of his chest and had cardiology  work him up in 2021 and everything was normal.  He thinks these pains are related to "esophageal spasms".  Goes on to tell me he thinks he just has problems with "my vagus nerve".  Also discusses a change in bowel habits, he will often be constipated with a bowel movement maybe once every 3 to 4 days.  Tried staying on a high-fiber diet but when he does this he sees a lot of bleeding "from my piles and hemorrhoids and fissures".  Also took MiraLAX as needed in the past, though tells me this gave him liquid watery stool even after just one dose.    Does describe an issue with anxiety.  Explains that he knows this affects his entire body and oftentimes he will have panic attacks and have a lot of GI symptoms.  He is trying to get this under control and is currently on Klonopin and Wellbutrin, but tells me it is hard.    Denies fever, chills, weight loss or symptoms that awaken him from sleep.  Past Medical History:  Diagnosis Date   ADD (attention deficit disorder)    Allergies    Anemia    Anxiety disorder    Arthritis    Asthma    Bladder problem    Bleeding disorder (HCC)    CAD (coronary artery disease)    Cancer (HCC)    Congenital abnormalities    Constipation    COPD (chronic obstructive pulmonary  disease) ([place])    Depression    Diabetes ([place])    Dialysis patient ([place])    Diverticulitis    DVT (deep venous thrombosis) ([place])    Ear problems    Eczema    ED (erectile dysfunction)    Fibromyalgia    GERD (gastroesophageal reflux disease)    Gout    Heart attack ([place])    Heart disease    High cholesterol    Hypertension    Hyperthyroidism    Hypogonadism in male    Hypothyroidism    Joint pain    Kidney disease    Kidney stone    Leg ulcer (HCC)    Liver disease    Obesity    OSA (obstructive sleep apnea)    Osteoporosis    Pacemaker    Pulmonary embolism (HCC)    REM behavioral disorder    Seizures (HCC)    Skin disorder    Stroke ([place])    Thyroid disorder     Tuberculosis     Past Surgical History:  Procedure Laterality Date   INSERT / REPLACE / REMOVE PACEMAKER      Current Outpatient Medications  Medication Sig Dispense Refill   amLODipine (NORVASC) 10 MG tablet Take 10 mg by mouth daily.     buPROPion (WELLBUTRIN SR) 150 MG 12 hr tablet Take 150 mg by mouth daily.     cetirizine (ZYRTEC) 10 MG tablet Take 10 mg by mouth as needed for allergies.      clonazePAM (KLONOPIN) 0.5 MG tablet Take 0.5 mg by mouth daily as needed for anxiety.     cyclobenzaprine (FLEXERIL) 10 MG tablet Take 10 mg by mouth 3 (three) times daily.     GARLIC PO Take by mouth as needed.     hydrochlorothiazide (HYDRODIURIL) 12.5 MG tablet Take 12.5 mg by mouth 3 times/day as needed-between meals & bedtime (BP).     ibuprofen (ADVIL) 600 MG tablet Take 600 mg by mouth every 6 (six) hours as needed for headache or moderate pain.     omeprazole (PRILOSEC) 40 MG capsule Take 40 mg by mouth as needed (heartburn).     No current facility-administered medications for this visit.    Allergies as of 11/16/2020 - Review Complete 11/16/2020  Allergen Reaction Noted   Lisinopril  12/30/2019    Family History  Problem Relation Age of Onset   Thyroid disease Mother    Hypertension Mother    Diabetes Father    Hypertension Father    Thyroid disease Maternal Grandfather    Other Maternal Grandfather        malignant tumor of the lung    Social History   Socioeconomic History   Marital status: Single    Spouse name: Not on file   Number of children: Not on file   Years of education: Not on file   Highest education level: Not on file  Occupational History   Not on file  Tobacco Use   Smoking status: Former    Types: Cigarettes, Cigars    Quit date: 2017    Years since quitting: 5.6   Smokeless tobacco: Never  Vaping Use   Vaping Use: Never used  Substance and Sexual Activity   Alcohol use: Not on file   Drug use: Not on file   Sexual activity: Not on file   Other Topics Concern   Not on file  Social History Narrative   Not on file   Social Determinants  of Health   Financial Resource Strain: Not on file  Food Insecurity: Not on file  Transportation Needs: Not on file  Physical Activity: Not on file  Stress: Not on file  Social Connections: Not on file  Intimate Partner Violence: Not on file    Review of Systems:    Constitutional: No weight loss, fever or chills Skin: No rash Cardiovascular: No chest pain Respiratory: No SOB Gastrointestinal: See HPI and otherwise negative Genitourinary: No dysuria Neurological: No headache, dizziness or syncope Musculoskeletal: No new muscle or joint pain Hematologic: No bleeding  Psychiatric: No history of depression or anxiety   Physical Exam:  Vital signs: BP 120/80   Pulse 70   Ht 5\' 8"  (1.727 m)   Wt (!) 389 lb (176.4 kg)   BMI 59.15 kg/m    Constitutional:   Pleasant morbidly obese male appears to be in NAD, Well developed, Well nourished, alert and cooperative Head:  Normocephalic and atraumatic. Eyes:   PEERL, EOMI. No icterus. Conjunctiva pink. Ears:  Normal auditory acuity. Neck:  Supple Throat: Oral cavity and pharynx without inflammation, swelling or lesion.  Respiratory: Respirations even and unlabored. Lungs clear to auscultation bilaterally.   No wheezes, crackles, or rhonchi.  Cardiovascular: Normal S1, S2. No MRG. Regular rate and rhythm. No peripheral edema, cyanosis or pallor.  Gastrointestinal:  Soft, nondistended, nontender. No rebound or guarding. Normal bowel sounds. No appreciable masses or hepatomegaly. Rectal:  Declined  Msk:  Symmetrical without gross deformities. Without edema, no deformity or joint abnormality.  Neurologic:  Alert and  oriented x4;  grossly normal neurologically.  Skin:   Dry and intact without significant lesions or rashes. Psychiatric: Demonstrates good judgement and reason without abnormal affect or behaviors.  See HPI for recent  labs/work up.  Assessment: 1.  Change in bowel habits: Towards constipation over the past few years, associated with some generalized abdominal cramping pain and spasms; likely IBS 2.  Generalized abdominal pain: With above 3.  GERD/gastritis: History of EGD in 2019 with gastritis, patient wondering if something is changed since then; likely still gastritis and GERD 4.  Rectal bleeding: Describes rectal bleeding from "piles/hemorrhoids/fissures", with variance in bowel habits as above and abdominal pain recommend further evaluation with colonoscopy; most likely IBS+ hemorrhoids  Plan: 1.  Discussed with patient that his anxiety is likely playing a large part of all of his symptoms.  It would help him to know that nothing else is going on.  Scheduled the patient for a diagnostic EGD and colonoscopy at the hospital due to his BMI.  Did provide the patient a detailed list of risks for the procedures and he agrees to proceed.  Patient was scheduled with Dr. Bryan Lemma at the end of December.  He was offered sooner appointments but wanted this one. 2.  Patient tells me he has tried Omeprazole in the past but this made no change to his symptoms.  He is also been on MiraLAX and also Dicyclomine.  Does not feel like any of these truly helped. 3.  Recommend he continue work with his primary care provider in regards to control of anxiety. 4.  Patient will likely require follow-up in the office after time of procedures to discuss findings and recommendations.  Ellouise Newer, PA-C [facility] 11/16/2020, 2:23 PM  Cc: Chaney Malling, PA

## 2020-11-16 NOTE — Patient Instructions (Signed)
You have been scheduled for an endoscopy and colonoscopy. Please follow the written instructions given to you at your visit today. Please pick up your prep supplies at the pharmacy within the next 1-3 days. If you use inhalers (even only as needed), please bring them with you on the day of your procedure.  If you are age 33 or older, your body mass index should be between 23-30. Your Body mass index is 59.15 kg/m. If this is out of the aforementioned range listed, please consider follow up with your Primary Care Provider.  If you are age 68 or younger, your body mass index should be between 19-25. Your Body mass index is 59.15 kg/m. If this is out of the aformentioned range listed, please consider follow up with your Primary Care Provider.   __________________________________________________________  The [facility] providers would like to encourage you to use [facility] to communicate with providers for non-urgent requests or questions.  Due to long hold times on the telephone, sending your provider a message by [facility] may be a faster and more efficient way to get a response.  Please allow 48 business hours for a response.  Please remember that this is for non-urgent requests.

## 2020-11-17 ENCOUNTER — Telehealth: Payer: Self-pay

## 2020-11-17 NOTE — Telephone Encounter (Signed)
-----   Message from [place], DO sent at 11/17/2020  8:16 AM EDT ----- Regarding: RE: Procedures Just read your note on this patient. I dont think we have any formal process in place for procedures scheduled this far out either, but not unreasonable to call to check in on him 3-4 weeks prior to his scheduled EGD/Colo.   Charee Tumblin, would you be able to set up a reminder of some sort to call and check in on the patient in early December to make sure no changes in clinical status and still wants to proceed with procedures?  ----- Message ----- From: Eddie Erp, PA Sent: 11/16/2020   3:05 PM EDT To: Lavena Bullion, DO Subject: Procedures                                     This patient got scheduled you for an EGD and colonoscopy due to BMI at the hospital on 12/29.  Apparently this was your next available day.  He declined appointments before then.  Being as this is so far out from now, not sure if someone should do some chart checking prior to this procedure to ensure he is still acceptable or to make sure anything has not happened between now and then.  Not sure if we have come up with a procedure for this sort of thing?  Let me know if you need me to do anything.  Thanks, JL L.

## 2020-11-17 NOTE — Progress Notes (Signed)
Agree with the assessment and plan as outlined by Jennifer Lemmon, PA-C. ? ?Ilaria Much, DO, FACG ? ?

## 2020-11-17 NOTE — Telephone Encounter (Signed)
Reminder in epic °

## 2020-11-30 DIAGNOSIS — E88819 Insulin resistance, unspecified: Secondary | ICD-10-CM

## 2020-11-30 HISTORY — DX: Insulin resistance, unspecified: E88.819

## 2021-02-08 ENCOUNTER — Other Ambulatory Visit: Payer: Self-pay | Admitting: Urgent Care

## 2021-02-08 DIAGNOSIS — E0781 Sick-euthyroid syndrome: Secondary | ICD-10-CM

## 2021-02-15 ENCOUNTER — Telehealth: Payer: Self-pay

## 2021-02-15 NOTE — Telephone Encounter (Signed)
-----   Message from Yevette Edwards, RN sent at 11/17/2020  9:36 AM EDT ----- Regarding: Chart review Check on patient to make sure no changes in clinical status prior to his colonoscopy/EGD at [facility] on Thursday, 03/09/21 at 7:30 am with Dr. Bryan Lemma.

## 2021-02-15 NOTE — Telephone Encounter (Signed)
Lm on vm for patient to return call 

## 2021-02-16 NOTE — Telephone Encounter (Signed)
Pt returned call. He states that there have been no changes to his medical history. He is still wanting to proceed with procedures as scheduled. Pt had questions regarding wether or not the procedures would be covered by his insurance. Advised that he should be receiving a call from the hospital in regards to billing. Pt verbalized understanding and had no concerns at the end of the call.

## 2021-02-17 ENCOUNTER — Other Ambulatory Visit: Payer: 59

## 2021-02-24 ENCOUNTER — Encounter (HOSPITAL_COMMUNITY): Payer: Self-pay | Admitting: Gastroenterology

## 2021-03-09 ENCOUNTER — Encounter (HOSPITAL_COMMUNITY): Payer: Self-pay | Admitting: Gastroenterology

## 2021-03-09 ENCOUNTER — Ambulatory Visit (HOSPITAL_COMMUNITY)
Admission: RE | Admit: 2021-03-09 | Discharge: 2021-03-09 | Disposition: A | Discharge status: Home / Self Care | Payer: 59 | Source: Ambulatory Visit | Attending: Gastroenterology | Admitting: Gastroenterology

## 2021-03-09 ENCOUNTER — Ambulatory Visit (HOSPITAL_COMMUNITY): Payer: 59 | Admitting: Certified Registered Nurse Anesthetist

## 2021-03-09 ENCOUNTER — Encounter (HOSPITAL_COMMUNITY)
Admission: RE | Disposition: A | Discharge status: Home / Self Care | Payer: Self-pay | Source: Ambulatory Visit | Attending: Gastroenterology

## 2021-03-09 ENCOUNTER — Other Ambulatory Visit: Payer: Self-pay

## 2021-03-09 DIAGNOSIS — K3189 Other diseases of stomach and duodenum: Secondary | ICD-10-CM

## 2021-03-09 DIAGNOSIS — K2980 Duodenitis without bleeding: Secondary | ICD-10-CM | POA: Insufficient documentation

## 2021-03-09 DIAGNOSIS — N289 Disorder of kidney and ureter, unspecified: Secondary | ICD-10-CM | POA: Diagnosis not present

## 2021-03-09 DIAGNOSIS — Z992 Dependence on renal dialysis: Secondary | ICD-10-CM | POA: Diagnosis not present

## 2021-03-09 DIAGNOSIS — K625 Hemorrhage of anus and rectum: Secondary | ICD-10-CM

## 2021-03-09 DIAGNOSIS — K648 Other hemorrhoids: Secondary | ICD-10-CM | POA: Insufficient documentation

## 2021-03-09 DIAGNOSIS — E119 Type 2 diabetes mellitus without complications: Secondary | ICD-10-CM | POA: Insufficient documentation

## 2021-03-09 DIAGNOSIS — I10 Essential (primary) hypertension: Secondary | ICD-10-CM | POA: Diagnosis not present

## 2021-03-09 DIAGNOSIS — K529 Noninfective gastroenteritis and colitis, unspecified: Secondary | ICD-10-CM | POA: Diagnosis not present

## 2021-03-09 DIAGNOSIS — I2510 Atherosclerotic heart disease of native coronary artery without angina pectoris: Secondary | ICD-10-CM | POA: Diagnosis not present

## 2021-03-09 DIAGNOSIS — G4733 Obstructive sleep apnea (adult) (pediatric): Secondary | ICD-10-CM | POA: Insufficient documentation

## 2021-03-09 DIAGNOSIS — R1013 Epigastric pain: Secondary | ICD-10-CM | POA: Diagnosis present

## 2021-03-09 DIAGNOSIS — K219 Gastro-esophageal reflux disease without esophagitis: Secondary | ICD-10-CM | POA: Diagnosis not present

## 2021-03-09 DIAGNOSIS — K2950 Unspecified chronic gastritis without bleeding: Secondary | ICD-10-CM | POA: Insufficient documentation

## 2021-03-09 DIAGNOSIS — R194 Change in bowel habit: Secondary | ICD-10-CM | POA: Diagnosis not present

## 2021-03-09 DIAGNOSIS — Z87891 Personal history of nicotine dependence: Secondary | ICD-10-CM | POA: Diagnosis not present

## 2021-03-09 DIAGNOSIS — R569 Unspecified convulsions: Secondary | ICD-10-CM | POA: Diagnosis not present

## 2021-03-09 DIAGNOSIS — Z6841 Body Mass Index (BMI) 40.0 and over, adult: Secondary | ICD-10-CM | POA: Insufficient documentation

## 2021-03-09 DIAGNOSIS — Z950 Presence of cardiac pacemaker: Secondary | ICD-10-CM | POA: Diagnosis not present

## 2021-03-09 DIAGNOSIS — R1084 Generalized abdominal pain: Secondary | ICD-10-CM | POA: Diagnosis not present

## 2021-03-09 DIAGNOSIS — I252 Old myocardial infarction: Secondary | ICD-10-CM | POA: Insufficient documentation

## 2021-03-09 DIAGNOSIS — K921 Melena: Secondary | ICD-10-CM | POA: Insufficient documentation

## 2021-03-09 DIAGNOSIS — K5900 Constipation, unspecified: Secondary | ICD-10-CM | POA: Diagnosis not present

## 2021-03-09 DIAGNOSIS — K640 First degree hemorrhoids: Secondary | ICD-10-CM

## 2021-03-09 HISTORY — PX: COLONOSCOPY WITH PROPOFOL: SHX5780

## 2021-03-09 HISTORY — PX: ESOPHAGOGASTRODUODENOSCOPY (EGD) WITH PROPOFOL: SHX5813

## 2021-03-09 HISTORY — PX: BIOPSY: SHX5522

## 2021-03-09 SURGERY — COLONOSCOPY WITH PROPOFOL
Anesthesia: Monitor Anesthesia Care

## 2021-03-09 MED ORDER — PROPOFOL 500 MG/50ML IV EMUL
INTRAVENOUS | Status: DC | PRN
  Administered 2021-03-09: 140 ug/kg/min via INTRAVENOUS

## 2021-03-09 MED ORDER — PROPOFOL 10 MG/ML IV BOLUS
INTRAVENOUS | Status: DC | PRN
  Administered 2021-03-09: 60 mg via INTRAVENOUS

## 2021-03-09 MED ORDER — PROPOFOL 10 MG/ML IV BOLUS
INTRAVENOUS | Status: AC
  Filled 2021-03-09: qty 20

## 2021-03-09 MED ORDER — SODIUM CHLORIDE 0.9 % IV SOLN: INTRAVENOUS | Status: DC

## 2021-03-09 MED ORDER — LIDOCAINE HCL (CARDIAC) PF 100 MG/5ML IV SOSY
PREFILLED_SYRINGE | INTRAVENOUS | Status: DC | PRN
  Administered 2021-03-09: 100 mg via INTRAVENOUS

## 2021-03-09 MED ORDER — PROPOFOL 1000 MG/100ML IV EMUL
INTRAVENOUS | Status: AC
  Filled 2021-03-09: qty 100

## 2021-03-09 MED ORDER — LACTATED RINGERS IV SOLN
INTRAVENOUS | Status: AC | PRN
  Administered 2021-03-09: 1000 mL via INTRAVENOUS

## 2021-03-09 SURGICAL SUPPLY — 25 items

## 2021-03-09 NOTE — H&P (Signed)
GASTROENTEROLOGY PROCEDURE H&P NOTE   Primary Care Physician: Chaney Malling, PA    Reason for Procedure:  Change in bowel habits, constipation, generalized abdominal pain, GERD, hematochezia  Plan:    EGD with biopsies, colonoscopy  Patient is appropriate for endoscopic procedure(s) in the ambulatory ([place]) setting.  The nature of the procedure, as well as the risks, benefits, and alternatives were carefully and thoroughly reviewed with the patient. Ample time for discussion and questions allowed. The patient understood, was satisfied, and agreed to proceed.     HPI: Eddie Murray is a 35 y.o. male who presents for EGD and colonoscopy for evaluation of multiple GI symptoms to include change in bowel habits, constipation, abdominal cramping, generalized abdominal pain, GERD with history of erosive esophagitis, and hematochezia.  Due to significant comorbidities to include morbid obesity (BMI 59), CAD, COPD, diabetes, procedures scheduled at [facility] for elevated periprocedural risks.  Past Medical History:  Diagnosis Date   ADD (attention deficit disorder)    Allergies    Anemia    Anxiety disorder    Arthritis    Asthma    Bladder problem    Bleeding disorder ([place])    CAD (coronary artery disease)    Cancer (HCC)    Congenital abnormalities    Constipation    COPD (chronic obstructive pulmonary disease) ([place])    Depression    Diabetes ([place])    Dialysis patient ([place])    Diverticulitis    DVT (deep venous thrombosis) (HCC)    Ear problems    Eczema    ED (erectile dysfunction)    Fibromyalgia    GERD (gastroesophageal reflux disease)    Gout    Heart attack ([place])    Heart disease    High cholesterol    Hypertension    Hyperthyroidism    Hypogonadism in male    Hypothyroidism    Joint pain    Kidney disease    Kidney stone    Leg ulcer (HCC)    Liver disease    Obesity    OSA (obstructive sleep apnea)    Osteoporosis    Pacemaker    Pulmonary  embolism (HCC)    REM behavioral disorder    Seizures ([place])    Skin disorder    Stroke ([place])    Thyroid disorder    Tuberculosis     Past Surgical History:  Procedure Laterality Date   INSERT / REPLACE / REMOVE PACEMAKER      Prior to Admission medications   Medication Sig Start Date End Date Taking? Authorizing Provider  Aspirin-Acetaminophen-Caffeine (GOODY HEADACHE PO) Take 1 packet by mouth daily as needed (pain).   Yes [provider]  baclofen (LIORESAL) 10 MG tablet Take 10 mg by mouth daily as needed for muscle spasms.   Yes [provider]  Carboxymethylcellul-Glycerin (LUBRICATING EYE DROPS OP) Place 1 drop into both eyes daily as needed (dry eyes).   Yes [provider]  clonazePAM (KLONOPIN) 0.5 MG tablet Take 0.5 mg by mouth 2 (two) times daily as needed for anxiety. 12/01/19  Yes [provider]  Cyanocobalamin (B-12 PO) Take 1 Dose by mouth daily as needed (energy). Liquid   Yes [provider]  Digestive Enzyme CAPS Take 2 capsules by mouth daily.   Yes [provider]  Ferrous Sulfate (IRON PO) Take 1 tablet by mouth daily as needed (feeling tired).   Yes [provider]  MAGNESIUM PO Take 1 tablet by mouth  at bedtime.   Yes [provider]  omeprazole (PRILOSEC) 40 MG capsule Take 40 mg by mouth daily as needed for heartburn. 12/04/20  Yes [provider]  propranolol ER (INDERAL LA) 60 MG 24 hr capsule Take 60 mg by mouth daily.   Yes [provider]  Vitamin D-Vitamin K (VITAMIN K2-VITAMIN D3 PO) Take 1 capsule by mouth daily.   Yes [provider]  VICTOZA 18 MG/3ML SOPN Inject 0.6 mg into the skin daily. 02/28/21   [provider]    Current Facility-Administered Medications  Medication Dose Route Frequency Provider Last Rate Last Admin   0.9 %  sodium chloride infusion   Intravenous Continuous Levin Erp, PA       lactated ringers infusion     Continuous PRN Misheel Gowans V, DO 10 mL/hr at 03/09/21 0707 1,000 mL at 03/09/21 0707    Allergies as of 11/16/2020 - Review Complete 11/16/2020  Allergen Reaction Noted   Lisinopril  12/30/2019    Family History  Problem Relation Age of Onset   Thyroid disease Mother    Hypertension Mother    Diabetes Father    Hypertension Father    Thyroid disease Maternal Grandfather    Other Maternal Grandfather        malignant tumor of the lung    Social History   Socioeconomic History   Marital status: Single    Spouse name: Not on file   Number of children: Not on file   Years of education: Not on file   Highest education level: Not on file  Occupational History   Not on file  Tobacco Use   Smoking status: Former    Types: Cigarettes, Cigars    Quit date: 2017    Years since quitting: 5.9   Smokeless tobacco: Never  Vaping Use   Vaping Use: Never used  Substance and Sexual Activity   Alcohol use: Not on file   Drug use: Not on file   Sexual activity: Not on file  Other Topics Concern   Not on file  Social History Narrative   Not on file   Social Determinants of Health   Financial Resource Strain: Not on file  Food Insecurity: Not on file  Transportation Needs: Not on file  Physical Activity: Not on file  Stress: Not on file  Social Connections: Not on file  Intimate Partner Violence: Not on file    Physical Exam: Vital signs in last 24 hours: @BP  (!) 141/67    Temp 97.9 F (36.6 C) (Oral)    Resp 14    Ht 5\' 8"  (1.727 m)    Wt (!) 178.5 kg    SpO2 100%    BMI 59.83 kg/m  GEN: NAD EYE: Sclerae anicteric ENT: MMM CV: Non-tachycardic Pulm: CTA b/l GI: Soft, NT/ND NEURO:  Alert & Oriented x [place], DO [facility]   03/09/2021 7:25 AM

## 2021-03-09 NOTE — Op Note (Signed)
[facility] Patient Name: Eddie Murray Procedure Date: 03/09/2021 MRN: [id number] Attending MD: Gerrit Heck , MD Date of Birth: 11-15-1985 CSN: [id number] Age: 35 Admit Type: Outpatient Procedure:                Upper GI endoscopy Indications:              Epigastric abdominal pain, Generalized abdominal                            pain, Suspected esophageal reflux Providers:                Gerrit Heck, MD, Jeanella Cara, RN,                            Tyna Jaksch Technician Referring MD:              Medicines:                Monitored Anesthesia Care Complications:            No immediate complications. Estimated Blood Loss:     Estimated blood loss was minimal. Procedure:                Pre-Anesthesia Assessment:                           - Prior to the procedure, a History and Physical                            was performed, and patient medications and                            allergies were reviewed. The patient's tolerance of                            previous anesthesia was also reviewed. The risks                            and benefits of the procedure and the sedation                            options and risks were discussed with the patient.                            All questions were answered, and informed consent                            was obtained. Prior Anticoagulants: The patient has                            taken no previous anticoagulant or antiplatelet                            agents. ASA Grade Assessment: IV - A patient with  severe systemic disease that is a constant threat                            to life. After reviewing the risks and benefits,                            the patient was deemed in satisfactory condition to                            undergo the procedure.                           After obtaining informed consent, the endoscope was                            passed  under direct vision. Throughout the                            procedure, the patient's blood pressure, pulse, and                            oxygen saturations were monitored continuously. The                            [id number]-H190 ([id number]) Olympus endoscope was introduced                            through the mouth, and advanced to the second part                            of duodenum. The upper GI endoscopy was                            accomplished without difficulty. The patient                            tolerated the procedure well. Scope In: Scope Out: Findings:      The examined esophagus was normal.      The entire examined stomach was normal. Biopsies were taken with a cold       forceps for Helicobacter pylori testing. Estimated blood loss was       minimal.      A single 10 mm nodule was found in the second portion of the duodenum,       located proximal to the ampulla. Biopsies were taken with a cold forceps       for histology. Estimated blood loss was minimal.      The mucosa was otherwise normal appearing in the duodenal bulb, in the       first portion of the duodenum and in the second portion of the duodenum. Impression:               - Normal esophagus.                           - Normal stomach. Biopsied.                           -  Nodule found in the duodenum. Biopsied.                           - Normal mucosa was found in the duodenal bulb, in                            the first portion of the duodenum and in the second                            portion of the duodenum. Moderate Sedation:      Not Applicable - Patient had care per Anesthesia. Recommendation:           - Patient has a contact number available for                            emergencies. The signs and symptoms of potential                            delayed complications were discussed with the                            patient. Return to normal activities tomorrow.                             Written discharge instructions were provided to the                            patient.                           - Resume previous diet.                           - Continue present medications.                           - Await pathology results.                           - If the duodenal nodule is adenomatous, will plan                            on referral to advanced endoscopy service for                            endoscopic removal which will likely require use of                            duodenoscope given position.                           - Colonoscopy today. Procedure Code(s):        --- Professional ---                           [phone], Esophagogastroduodenoscopy,  flexible,                            transoral; with biopsy, single or multiple Diagnosis Code(s):        --- Professional ---                           K31.89, Other diseases of stomach and duodenum                           R10.13, Epigastric pain                           R10.84, Generalized abdominal pain CPT copyright 2019 American Medical Association. All rights reserved. The codes documented in this report are preliminary and upon coder review may  be revised to meet current compliance requirements. Gerrit Heck, MD 03/09/2021 8:23:45 AM Number of Addenda: 0

## 2021-03-09 NOTE — Anesthesia Postprocedure Evaluation (Signed)
Anesthesia Post Note  Patient: [employer]  Procedure(s) Performed: COLONOSCOPY WITH PROPOFOL ESOPHAGOGASTRODUODENOSCOPY (EGD) WITH PROPOFOL BIOPSY     Patient location during evaluation: PACU Anesthesia Type: MAC Level of consciousness: awake and alert Pain management: pain level controlled Vital Signs Assessment: post-procedure vital signs reviewed and stable Respiratory status: spontaneous breathing, nonlabored ventilation, respiratory function stable and patient connected to nasal cannula oxygen Cardiovascular status: stable and blood pressure returned to baseline Postop Assessment: no apparent nausea or vomiting Anesthetic complications: no   No notable events documented.  Last Vitals:  Vitals:   03/09/21 0840 03/09/21 0847  BP: (!) 144/83 139/75  Pulse: 62 65  Resp: 17 18  Temp:    SpO2: 95% 97%    Last Pain:  Vitals:   03/09/21 0847  TempSrc:   PainSc: 0-No pain                 Dameian Crisman S

## 2021-03-09 NOTE — Anesthesia Preprocedure Evaluation (Signed)
Anesthesia Evaluation  Patient identified by MRN, date of birth, ID band Patient awake    Reviewed: Allergy & Precautions, NPO status , Patient's Chart, lab work & pertinent test results  Airway Mallampati: III  TM Distance: <3 FB Neck ROM: Full    Dental no notable dental hx.    Pulmonary asthma , sleep apnea , COPD, former smoker,    Pulmonary exam normal breath sounds clear to auscultation + decreased breath sounds      Cardiovascular hypertension, + CAD and + Past MI  Normal cardiovascular exam+ pacemaker  Rhythm:Regular Rate:Normal     Neuro/Psych Seizures -,  negative psych ROS   GI/Hepatic Neg liver ROS, GERD  ,  Endo/Other  diabetesHypothyroidism Hyperthyroidism Morbid obesity  Renal/GU DialysisRenal disease  negative genitourinary   Musculoskeletal negative musculoskeletal ROS (+)   Abdominal (+) + obese,   Peds negative pediatric ROS (+)  Hematology negative hematology ROS (+)   Anesthesia Other Findings   Reproductive/Obstetrics negative OB ROS                             Anesthesia Physical Anesthesia Plan  ASA: 4  Anesthesia Plan: MAC   Post-op Pain Management:    Induction: Intravenous  PONV Risk Score and Plan: 1 and Propofol infusion  Airway Management Planned: Nasal Cannula  Additional Equipment:   Intra-op Plan:   Post-operative Plan:   Informed Consent: I have reviewed the patients History and Physical, chart, labs and discussed the procedure including the risks, benefits and alternatives for the proposed anesthesia with the patient or authorized representative who has indicated his/her understanding and acceptance.     Dental advisory given  Plan Discussed with: CRNA and Surgeon  Anesthesia Plan Comments:         Anesthesia Quick Evaluation

## 2021-03-09 NOTE — Transfer of Care (Signed)
Immediate Anesthesia Transfer of Care Note  Patient: Eddie Murray  Procedure(s) Performed: COLONOSCOPY WITH PROPOFOL ESOPHAGOGASTRODUODENOSCOPY (EGD) WITH PROPOFOL BIOPSY  Patient Location: PACU and Endoscopy Unit  Anesthesia Type:MAC  Level of Consciousness: awake and drowsy  Airway & Oxygen Therapy: Patient Spontanous Breathing and Patient connected to face mask oxygen  Post-op Assessment: Report given to RN and Post -op Vital signs reviewed and stable  Post vital signs: Reviewed and stable  Last Vitals:  Vitals Value Taken Time  BP 141/72 03/09/21 0820  Temp    Pulse 60 03/09/21 0819  Resp 17 03/09/21 0819  SpO2 99 % 03/09/21 0819  Vitals shown include unvalidated device data.  Last Pain:  Vitals:   03/09/21 0643  TempSrc: Oral  PainSc: 0-No pain         Complications: No notable events documented.

## 2021-03-09 NOTE — Discharge Instructions (Signed)
YOU HAD AN ENDOSCOPIC PROCEDURE TODAY: Refer to the procedure report and other information in the discharge instructions given to you for any specific questions about what was found during the examination. If this information does not answer your questions, please call [place] office at [phone] to clarify.  ° °YOU SHOULD EXPECT: Some feelings of bloating in the abdomen. Passage of more gas than usual. Walking can help get rid of the air that was put into your GI tract during the procedure and reduce the bloating. If you had a lower endoscopy (such as a colonoscopy or flexible sigmoidoscopy) you may notice spotting of blood in your stool or on the toilet paper. Some abdominal soreness may be present for a day or two, also. ° °DIET: Your first meal following the procedure should be a light meal and then it is ok to progress to your normal diet. A half-sandwich or bowl of soup is an example of a good first meal. Heavy or fried foods are harder to digest and may make you feel nauseous or bloated. Drink plenty of fluids but you should avoid alcoholic beverages for 24 hours. If you had a esophageal dilation, please see attached instructions for diet.   ° °ACTIVITY: Your care partner should take you home directly after the procedure. You should plan to take it easy, moving slowly for the rest of the day. You can resume normal activity the day after the procedure however YOU SHOULD NOT DRIVE, use power tools, machinery or perform tasks that involve climbing or major physical exertion for 24 hours (because of the sedation medicines used during the test).  ° °SYMPTOMS TO REPORT IMMEDIATELY: °A gastroenterologist can be reached at any hour. Please call [phone]  for any of the following symptoms:  °Following lower endoscopy (colonoscopy, flexible sigmoidoscopy) °Excessive amounts of blood in the stool  °Significant tenderness, worsening of abdominal pains  °Swelling of the abdomen that is new, acute  °Fever of 100° or  higher  °Following upper endoscopy (EGD, EUS, ERCP, esophageal dilation) °Vomiting of blood or coffee ground material  °New, significant abdominal pain  °New, significant chest pain or pain under the shoulder blades  °Painful or persistently difficult swallowing  °New shortness of breath  °Black, tarry-looking or red, bloody stools ° °FOLLOW UP:  °If any biopsies were taken you will be contacted by phone or by letter within the next 1-3 weeks. Call [phone]  if you have not heard about the biopsies in 3 weeks.  °Please also call with any specific questions about appointments or follow up tests. ° °

## 2021-03-09 NOTE — Op Note (Signed)
[facility] Patient Name: Eddie Murray Procedure Date: 03/09/2021 MRN: [id number] Attending MD: Gerrit Heck , MD Date of Birth: 1985/04/07 CSN: [id number] Age: 35 Admit Type: Outpatient Procedure:                Colonoscopy Indications:              Generalized abdominal pain, Hematochezia, Change in                            bowel habits, Constipation Providers:                Gerrit Heck, MD, Jeanella Cara, RN,                            Tyna Jaksch Technician Referring MD:              Medicines:                Monitored Anesthesia Care Complications:            No immediate complications. Estimated Blood Loss:     Estimated blood loss was minimal. Procedure:                Pre-Anesthesia Assessment:                           - Prior to the procedure, a History and Physical                            was performed, and patient medications and                            allergies were reviewed. The patient's tolerance of                            previous anesthesia was also reviewed. The risks                            and benefits of the procedure and the sedation                            options and risks were discussed with the patient.                            All questions were answered, and informed consent                            was obtained. Prior Anticoagulants: The patient has                            taken no previous anticoagulant or antiplatelet                            agents. ASA Grade Assessment: IV - A patient with                            severe systemic  disease that is a constant threat                            to life. After reviewing the risks and benefits,                            the patient was deemed in satisfactory condition to                            undergo the procedure.                           After obtaining informed consent, the colonoscope                            was passed under  direct vision. Throughout the                            procedure, the patient's blood pressure, pulse, and                            oxygen saturations were monitored continuously. The                            [id number]-HQ190L ([id number]) Olympus colonoscope was                            introduced through the anus and advanced to the 10                            cm into the ileum. The colonoscopy was performed                            without difficulty. The patient tolerated the                            procedure well. The quality of the bowel                            preparation was good. The terminal ileum, ileocecal                            valve, appendiceal orifice, and rectum were                            photographed. Scope In: 7:55:35 AM Scope Out: 8:12:38 AM Total Procedure Duration: 0 hours 17 minutes 3 seconds  Findings:      The perianal and digital rectal examinations were normal.      The entire colon appeared normal. No areas of luminal narrowing,       stricture, or obstructio noted. No areas of mucosal erythema, edema, or       ulceration.      Non-bleeding internal hemorrhoids were found during retroflexion. The       hemorrhoids were small.  There were mild inflammatory changes localized to the ileocecal valve,       characterized by edema and pinpoint aphthae. The colonoscope was       advanced approximately 10 cm into the terminal ileum, which all       otherwise appeared normal. Biopsies were taken with a cold forceps for       histology. Estimated blood loss was minimal. Impression:               - The entire examined colon is normal.                           - Non-bleeding internal hemorrhoids.                           - There were mild inflammatory changes localized to                            the ileocecal valve, characterized by edema and                            pinpoint aphthae. The colonoscope was advanced                             approximately 10 cm into the terminal ileum, which                            all otherwise appeared normal. Biopsied. Moderate Sedation:      Not Applicable - Patient had care per Anesthesia. Recommendation:           - Patient has a contact number available for                            emergencies. The signs and symptoms of potential                            delayed complications were discussed with the                            patient. Return to normal activities tomorrow.                            Written discharge instructions were provided to the                            patient.                           - Resume previous diet.                           - Continue present medications.                           - Await pathology results.                           -  Repeat colonoscopy at age 89 for screening                            purposes.                           - Return to GI office PRN.                           - Use fiber, for example Citrucel, Fibercon, Konsyl                            or Metamucil. Procedure Code(s):        --- Professional ---                           [phone], Colonoscopy, flexible; with biopsy, single                            or multiple Diagnosis Code(s):        --- Professional ---                           K64.8, Other hemorrhoids                           R10.84, Generalized abdominal pain                           K92.1, Melena (includes Hematochezia)                           R19.4, Change in bowel habit                           K59.00, Constipation, unspecified CPT copyright 2019 American Medical Association. All rights reserved. The codes documented in this report are preliminary and upon coder review may  be revised to meet current compliance requirements. Gerrit Heck, MD 03/09/2021 8:29:43 AM Number of Addenda: 0

## 2021-03-09 NOTE — Interval H&P Note (Signed)
History and Physical Interval Note:  03/09/2021 7:28 AM  Eddie Murray  has presented today for surgery, with the diagnosis of epigastric pain, constipation, change in bowel habits, rectal bleeding.  The various methods of treatment have been discussed with the patient and family. After consideration of risks, benefits and other options for treatment, the patient has consented to  Procedure(s): COLONOSCOPY WITH PROPOFOL (N/A) ESOPHAGOGASTRODUODENOSCOPY (EGD) WITH PROPOFOL (N/A) as a surgical intervention.  The patient's history has been reviewed, patient examined, no change in status, stable for surgery.  I have reviewed the patient's chart and labs.  Questions were answered to the patient's satisfaction.     Eddie Murray

## 2021-03-10 ENCOUNTER — Encounter (HOSPITAL_COMMUNITY): Payer: Self-pay | Admitting: Gastroenterology

## 2021-03-14 LAB — SURGICAL PATHOLOGY

## 2021-03-15 ENCOUNTER — Other Ambulatory Visit: Payer: 59

## 2021-03-15 ENCOUNTER — Telehealth: Payer: Self-pay | Admitting: General Surgery

## 2021-03-15 NOTE — Telephone Encounter (Signed)
-----   Message from [place], DO sent at 03/15/2021 11:33 AM EST ----- I do not think I attached to on the MyChart message I sent to this patient.  Can you please review that message.  Patient needs to start taking his Prilosec 40 mg twice daily to heal peptic duodenitis and follow-up appointment with me in 2-3 months.  Thank you. ----- Message ----- From: Interface, [facility] [place] Sent: 03/14/2021   1:46 PM EST To: Gerrit Heck V, DO

## 2021-03-15 NOTE — Telephone Encounter (Signed)
Contacted the patient and advised him to take prilosec 40mg  twice daily and return to the clinic in 2-3 months. The patient verbalized understanding.

## 2021-03-16 ENCOUNTER — Ambulatory Visit
Admission: RE | Admit: 2021-03-16 | Discharge: 2021-03-16 | Disposition: A | Discharge status: Home / Self Care | Payer: 59 | Source: Ambulatory Visit | Attending: Urgent Care | Admitting: Urgent Care

## 2021-03-16 DIAGNOSIS — E079 Disorder of thyroid, unspecified: Secondary | ICD-10-CM | POA: Diagnosis not present

## 2021-03-16 DIAGNOSIS — E0781 Sick-euthyroid syndrome: Secondary | ICD-10-CM

## 2021-03-23 DIAGNOSIS — E0781 Sick-euthyroid syndrome: Secondary | ICD-10-CM | POA: Diagnosis not present

## 2021-03-23 DIAGNOSIS — R102 Pelvic and perineal pain: Secondary | ICD-10-CM | POA: Diagnosis not present

## 2021-04-12 ENCOUNTER — Ambulatory Visit (INDEPENDENT_AMBULATORY_CARE_PROVIDER_SITE_OTHER): Payer: 59 | Admitting: Nurse Practitioner

## 2021-04-12 ENCOUNTER — Other Ambulatory Visit: Payer: Self-pay

## 2021-04-12 ENCOUNTER — Encounter: Payer: Self-pay | Admitting: Nurse Practitioner

## 2021-04-12 VITALS — BP 113/70 | HR 64 | Temp 98.0°F | Ht 67.0 in | Wt 363.4 lb

## 2021-04-12 DIAGNOSIS — Z6841 Body Mass Index (BMI) 40.0 and over, adult: Secondary | ICD-10-CM | POA: Insufficient documentation

## 2021-04-12 DIAGNOSIS — I10 Essential (primary) hypertension: Secondary | ICD-10-CM

## 2021-04-12 DIAGNOSIS — R7301 Impaired fasting glucose: Secondary | ICD-10-CM

## 2021-04-12 DIAGNOSIS — K219 Gastro-esophageal reflux disease without esophagitis: Secondary | ICD-10-CM

## 2021-04-12 DIAGNOSIS — Z7689 Persons encountering health services in other specified circumstances: Secondary | ICD-10-CM

## 2021-04-12 DIAGNOSIS — R69 Illness, unspecified: Secondary | ICD-10-CM | POA: Diagnosis not present

## 2021-04-12 DIAGNOSIS — F411 Generalized anxiety disorder: Secondary | ICD-10-CM | POA: Insufficient documentation

## 2021-04-12 HISTORY — DX: Impaired fasting glucose: R73.01

## 2021-04-12 HISTORY — DX: Body Mass Index (BMI) 40.0 and over, adult: Z684

## 2021-04-12 MED ORDER — OZEMPIC (0.25 OR 0.5 MG/DOSE) 2 MG/1.5ML ~~LOC~~ SOPN: 0.5000 mg | PEN_INJECTOR | SUBCUTANEOUS | 3 refills | Status: DC

## 2021-04-12 NOTE — Progress Notes (Signed)
New Patient Office Visit  Subjective:  Patient ID: Eddie Murray, male    DOB: [birth year]  Age: 36 y.o. MRN: [id number]  CC:  Chief Complaint  Patient presents with   [place] presents to establish new primary care provider. Has been seeing providers at [facility]. States they were changing providers every few months and he wanted to find a practice with more stability. States that he recently had a full panel of blood work. 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 Thyroid antibodies have been elevated though he is not quite hypothyroid at that time. He does have a family history of hypothyroid.  Recently had colonoscopy and endoscopy. Colonoscopy was clear. Did have ulcer in the stomach. Currently working through that with GI provider.  He continues to have trouble with his stomach since 2019. Has found specific foods which make the pain and spasms worse. Did have gallbladder removed in 2010. He takes multiple supplements to help digestion and improve energy. He has been prescribed baclofen for the spasms in his esophagus. He does have history of anxiety. Does currently have a prescription for clonazepam 0.5mg . this is prescribed to him as twice daily. He states that he takes this only when needed and a single prescription lasts him for quite a while.   Past Medical History:  Diagnosis Date   ADD (attention deficit disorder)    Allergies    Anemia    Anxiety disorder    Arthritis    Asthma    Bladder problem    Bleeding disorder ([place])    CAD (coronary artery disease)    Cancer (HCC)    Congenital abnormalities    Constipation    COPD (chronic obstructive pulmonary disease) ([place])    Depression    Diabetes ([place])    Dialysis patient ([place])    Diverticulitis    DVT (deep venous thrombosis) (HCC)    Ear problems    Eczema    ED (erectile dysfunction)    Fibromyalgia    GERD (gastroesophageal reflux disease)    Gout    Heart attack ([place])     Heart disease    High cholesterol    Hypertension    Hyperthyroidism    Hypogonadism in male    Hypothyroidism    Joint pain    Kidney disease    Kidney stone    Leg ulcer ([place])    Liver disease    Obesity    OSA (obstructive sleep apnea)    Osteoporosis    Pacemaker    Pulmonary embolism ([place])    REM behavioral disorder    Seizures ([place])    Skin disorder    Stroke [facility])    Thyroid disorder    Tuberculosis     Past Surgical History:  Procedure Laterality Date   BIOPSY  03/09/2021   Procedure: BIOPSY;  Surgeon: Lavena Bullion, DO;  Location: [facility] ENDOSCOPY;  Service: Gastroenterology;;  EGD and COLON   COLONOSCOPY WITH PROPOFOL N/A 03/09/2021   Procedure: COLONOSCOPY WITH PROPOFOL;  Surgeon: Lavena Bullion, DO;  Location: [facility];  Service: Gastroenterology;  Laterality: N/A;   ESOPHAGOGASTRODUODENOSCOPY (EGD) WITH PROPOFOL N/A 03/09/2021   Procedure: ESOPHAGOGASTRODUODENOSCOPY (EGD) WITH PROPOFOL;  Surgeon: Lavena Bullion, DO;  Location: [facility] ENDOS[facility];  Service: Gastroenterology;  Laterality: N/A;   INSERT / REPLACE / REMOVE PACEMAKER      Family History  Problem Relation Age of Onset   Thyroid disease Mother  Hypertension Mother    Diabetes Father    Hypertension Father    Thyroid disease Maternal Grandfather    Other Maternal Grandfather        malignant tumor of the lung    Social History   Socioeconomic History   Marital status: Single    Spouse name: Not on file   Number of children: Not on file   Years of education: Not on file   Highest education level: Not on file  Occupational History   Not on file  Tobacco Use   Smoking status: Former    Types: Cigarettes, Cigars    Quit date: 2017    Years since quitting: 6.0   Smokeless tobacco: Never  Vaping Use   Vaping Use: Never used  Substance and Sexual Activity   Alcohol use: Never   Drug use: Never   Sexual activity: Not Currently  Other Topics Concern   Not on file  Social  History Narrative   Not on file   Social Determinants of Health   Financial Resource Strain: Not on file  Food Insecurity: Not on file  Transportation Needs: Not on file  Physical Activity: Not on file  Stress: Not on file  Social Connections: Not on file  Intimate Partner Violence: Not on file    ROS Review of Systems  Constitutional:  Positive for fatigue. Negative for activity change, chills and fever.  HENT:  Negative for congestion, postnasal drip, rhinorrhea, sinus pressure, sinus pain, sneezing and sore throat.   Eyes: Negative.   Respiratory:  Negative for cough, shortness of breath and wheezing.   Cardiovascular:  Positive for palpitations. Negative for chest pain.  Gastrointestinal:  Positive for constipation. Negative for diarrhea, nausea and vomiting.       GERD  Endocrine: Negative for cold intolerance, heat intolerance, polydipsia and polyuria.       Thyroid problems.   Genitourinary:  Negative for dysuria, frequency and urgency.  Musculoskeletal:  Negative for back pain and myalgias.  Skin:  Negative for rash.  Allergic/Immunologic: Negative for environmental allergies.  Neurological:  Negative for dizziness, weakness and headaches.  Psychiatric/Behavioral:  The patient is not nervous/anxious.    Objective:   Today's Vitals   04/12/21 1046  BP: 113/70  Pulse: 64  Temp: 98 F (36.7 C)  SpO2: 97%  Weight: (!) 363 lb 6.4 oz (164.8 kg)  Height: 5\' 7"  (1.702 m)   Body mass index is 56.92 kg/m.   Physical Exam Vitals and nursing note reviewed.  Constitutional:      Appearance: Normal appearance. He is well-developed. He is obese.  HENT:     Head: Normocephalic.     Nose: Nose normal.     Mouth/Throat:     Mouth: Mucous membranes are moist.     Pharynx: Oropharynx is clear.  Eyes:     Extraocular Movements: Extraocular movements intact.     Conjunctiva/sclera: Conjunctivae normal.     Pupils: Pupils are equal, round, and reactive to light.   Cardiovascular:     Rate and Rhythm: Normal rate and regular rhythm.     Pulses: Normal pulses.     Heart sounds: Normal heart sounds.  Pulmonary:     Effort: Pulmonary effort is normal.     Breath sounds: Normal breath sounds.  Abdominal:     Palpations: Abdomen is soft.  Musculoskeletal:        General: Normal range of motion.     Cervical back: Normal range of motion  and neck supple.  Lymphadenopathy:     Cervical: No cervical adenopathy.  Skin:    General: Skin is warm and dry.     Capillary Refill: Capillary refill takes less than 2 seconds.  Neurological:     General: No focal deficit present.     Mental Status: He is alert and oriented to person, place, and time.  Psychiatric:        Mood and Affect: Mood normal.        Behavior: Behavior normal.        Thought Content: Thought content normal.        Judgment: Judgment normal.    Assessment & Plan:  1. Encounter to establish care Appointment today to establish new primary care provider. Will get records and labs to review and update patent chart   2. Impaired fasting glucose Change victoza to ozempic 0.5mg  weekly. New prescription sent to pharmacy. Limit intake of carohydrates and sugar. Will check Hgba1c prior to next visit. Adjust dosing as indicated . - Semaglutide,0.25 or 0.5MG /DOS, (OZEMPIC, 0.25 OR 0.5 MG/DOSE,) 2 MG/1.5ML SOPN; Inject 0.5 mg into the skin once a week.  Dispense: 3 mL; Refill: 3  3. Body mass index (BMI) of 50-59.9 in adult [facility]) Encourage patient to limit calorie intake to 2000 cal/day or less.  He should consume a low cholesterol, low-fat diet.  Change victoza to ozempic 0.5mg  weekly to help with appetite control and weight loss.  - Semaglutide,0.25 or 0.5MG /DOS, (OZEMPIC, 0.25 OR 0.5 MG/DOSE,) 2 MG/1.5ML SOPN; Inject 0.5 mg into the skin once a week.  Dispense: 3 mL; Refill: 3  4. Essential hypertension Stable. Continue blood pressure medication as prescribed   5. Generalized anxiety  disorder Well managed with current medication. Takes clonazepam on as needed basis only. Does not need refills at this time.   6. Gastroesophageal reflux disease without esophagitis Currently stable. Continue visits with GI provider as scheduled.     Problem List Items Addressed This Visit       Cardiovascular and Mediastinum   Essential hypertension     Digestive   Gastroesophageal reflux disease without esophagitis     Endocrine   Impaired fasting glucose   Relevant Medications   Semaglutide,0.25 or 0.5MG /DOS, (OZEMPIC, 0.25 OR 0.5 MG/DOSE,) 2 MG/1.5ML SOPN     Other   Body mass index (BMI) of 50-59.9 in adult [facility])   Relevant Medications   Semaglutide,0.25 or 0.5MG /DOS, (OZEMPIC, 0.25 OR 0.5 MG/DOSE,) 2 MG/1.5ML SOPN   Generalized anxiety disorder   Other Visit Diagnoses     Encounter to establish care    -  Primary       Outpatient Encounter Medications as of 04/12/2021  Medication Sig   Aspirin-Acetaminophen-Caffeine (GOODY HEADACHE PO) Take 1 packet by mouth daily as needed (pain).   baclofen (LIORESAL) 10 MG tablet Take 10 mg by mouth daily as needed for muscle spasms.   Carboxymethylcellul-Glycerin (LUBRICATING EYE DROPS OP) Place 1 drop into both eyes daily as needed (dry eyes).   clonazePAM (KLONOPIN) 0.5 MG tablet Take 0.5 mg by mouth 2 (two) times daily as needed for anxiety.   Cyanocobalamin (B-12 PO) Take 1 Dose by mouth daily as needed (energy). Liquid   Digestive Enzyme CAPS Take 2 capsules by mouth daily.   Ferrous Sulfate (IRON PO) Take 1 tablet by mouth daily as needed (feeling tired).   MAGNESIUM PO Take 1 tablet by mouth at bedtime.   omeprazole (PRILOSEC) 40 MG capsule Take 40 mg by  mouth daily as needed for heartburn.   propranolol ER (INDERAL LA) 60 MG 24 hr capsule Take 60 mg by mouth daily.   Semaglutide,0.25 or 0.5MG /DOS, (OZEMPIC, 0.25 OR 0.5 MG/DOSE,) 2 MG/1.5ML SOPN Inject 0.5 mg into the skin once a week.   Vitamin D-Vitamin K (VITAMIN  K2-VITAMIN D3 PO) Take 1 capsule by mouth daily.   [DISCONTINUED] VICTOZA 18 MG/3ML SOPN Inject 0.6 mg into the skin daily.   No facility-administered encounter medications on file as of 04/12/2021.    Follow-up: Return in about 6 weeks (around 05/24/2021) for health maintenance exam, FBW a week prior to . add free T4 and thyroid autoantibodies .   Ronnell Fresh[place], NP

## 2021-04-12 NOTE — Patient Instructions (Signed)
Fat and Cholesterol Restricted Eating Plan Getting too much fat and cholesterol in your diet may cause health problems. Choosing the right foods helps keep your fat and cholesterol at normal levels. This can keep you from getting certain diseases. Your doctor may recommend an eating plan that includes: Total fat: ______% or less of total calories a day. This is ______g of fat a day. Saturated fat: ______% or less of total calories a day. This is ______g of saturated fat a day. Cholesterol: less than _________mg a day. Fiber: ______g a day. What are tips for following this plan? General tips Work with your doctor to lose weight if you need to. Avoid: Foods with added sugar. Fried foods. Foods with trans fat or partially hydrogenated oils. This includes some margarines and baked goods. If you drink alcohol: Limit how much you have to: 0-1 drink a day for women who are not pregnant. 0-2 drinks a day for men. Know how much alcohol is in a drink. In the [place] one drink equals one 12 oz bottle of beer (355 mL), one 5 oz glass of wine (148 mL), or one 1 oz glass of hard liquor (44 mL). Reading food labels Check food labels for: Trans fats. Partially hydrogenated oils. Saturated fat (g) in each serving. Cholesterol (mg) in each serving. Fiber (g) in each serving. Choose foods with healthy fats, such as: Monounsaturated fats and polyunsaturated fats. These include olive and canola oil, flaxseeds, walnuts, almonds, and seeds. Omega-3 fats. These are found in certain fish, flaxseed oil, and ground flaxseeds. Choose grain products that have whole grains. Look for the word "whole" as the first word in the ingredient list. Cooking Cook foods using low-fat methods. These include baking, boiling, grilling, and broiling. Eat more home-cooked foods. Eat at restaurants and buffets less often. Eat less fast food. Avoid cooking using saturated fats, such as butter, cream, palm oil, palm kernel oil, and  coconut oil. Meal planning  At meals, divide your plate into four equal parts: Fill one-half of your plate with vegetables, green salads, and fruit. Fill one-fourth of your plate with whole grains. Fill one-fourth of your plate with low-fat (lean) protein foods. Eat fish that is high in omega-3 fats at least two times a week. This includes mackerel, tuna, sardines, and salmon. Eat foods that are high in fiber, such as whole grains, beans, apples, pears, berries, broccoli, carrots, peas, and barley. What foods should I eat? Fruits All fresh, canned (in natural juice), or frozen fruits. Vegetables Fresh or frozen vegetables (raw, steamed, roasted, or grilled). Green salads. Grains Whole grains, such as whole wheat or whole grain breads, crackers, cereals, and pasta. Unsweetened oatmeal, bulgur, barley, quinoa, or brown rice. Corn or whole wheat flour tortillas. Meats and other protein foods Ground beef (85% or leaner), grass-fed beef, or beef trimmed of fat. Skinless chicken or turkey. Ground chicken or turkey. Pork trimmed of fat. All fish and seafood. Egg whites. Dried beans, peas, or lentils. Unsalted nuts or seeds. Unsalted canned beans. Nut butters without added sugar or oil. Dairy Low-fat or nonfat dairy products, such as skim or 1% milk, 2% or reduced-fat cheeses, low-fat and fat-free ricotta or cottage cheese, or plain low-fat and nonfat yogurt. Fats and oils Tub margarine without trans fats. Light or reduced-fat mayonnaise and salad dressings. Avocado. Olive, canola, sesame, or safflower oils. The items listed above may not be a complete list of foods and beverages you can eat. Contact a dietitian for more information. What foods   should I avoid? Fruits Canned fruit in heavy syrup. Fruit in cream or butter sauce. Fried fruit. Vegetables Vegetables cooked in cheese, cream, or butter sauce. Fried vegetables. Grains White bread. White pasta. White rice. Cornbread. Bagels, pastries,  and croissants. Crackers and snack foods that contain trans fat and hydrogenated oils. Meats and other protein foods Fatty cuts of meat. Ribs, chicken wings, bacon, sausage, bologna, salami, chitterlings, fatback, hot dogs, bratwurst, and packaged lunch meats. Liver and organ meats. Whole eggs and egg yolks. Chicken and turkey with skin. Fried meat. Dairy Whole or 2% milk, cream, half-and-half, and cream cheese. Whole milk cheeses. Whole-fat or sweetened yogurt. Full-fat cheeses. Nondairy creamers and whipped toppings. Processed cheese, cheese spreads, and cheese curds. Fats and oils Butter, stick margarine, lard, shortening, ghee, or bacon fat. Coconut, palm kernel, and palm oils. Beverages Alcohol. Sugar-sweetened drinks such as sodas, lemonade, and fruit drinks. Sweets and desserts Corn syrup, sugars, honey, and molasses. Candy. Jam and jelly. Syrup. Sweetened cereals. Cookies, pies, cakes, donuts, muffins, and ice cream. The items listed above may not be a complete list of foods and beverages you should avoid. Contact a dietitian for more information. Summary Choosing the right foods helps keep your fat and cholesterol at normal levels. This can keep you from getting certain diseases. At meals, fill one-half of your plate with vegetables, green salads, and fruits. Eat high fiber foods, like whole grains, beans, apples, pears, berries, carrots, peas, and barley. Limit added sugar, saturated fats, alcohol, and fried foods. This information is not intended to replace advice given to you by your health care provider. Make sure you discuss any questions you have with your health care provider. Document Revised: 07/08/2020 Document Reviewed: 07/08/2020 Elsevier Patient Education  2022 [employer]  

## 2021-05-11 DIAGNOSIS — F411 Generalized anxiety disorder: Secondary | ICD-10-CM | POA: Diagnosis not present

## 2021-05-11 DIAGNOSIS — I10 Essential (primary) hypertension: Secondary | ICD-10-CM | POA: Diagnosis not present

## 2021-05-11 DIAGNOSIS — R69 Illness, unspecified: Secondary | ICD-10-CM | POA: Diagnosis not present

## 2021-05-11 DIAGNOSIS — E8881 Metabolic syndrome: Secondary | ICD-10-CM | POA: Diagnosis not present

## 2021-05-25 ENCOUNTER — Other Ambulatory Visit: Payer: Self-pay

## 2021-05-25 ENCOUNTER — Encounter: Payer: Self-pay | Admitting: Nurse Practitioner

## 2021-05-25 ENCOUNTER — Ambulatory Visit (INDEPENDENT_AMBULATORY_CARE_PROVIDER_SITE_OTHER): Payer: 59 | Admitting: Nurse Practitioner

## 2021-05-25 VITALS — BP 111/70 | HR 60 | Temp 97.6°F | Ht 66.93 in | Wt 352.3 lb

## 2021-05-25 DIAGNOSIS — Z6841 Body Mass Index (BMI) 40.0 and over, adult: Secondary | ICD-10-CM

## 2021-05-25 DIAGNOSIS — E039 Hypothyroidism, unspecified: Secondary | ICD-10-CM

## 2021-05-25 DIAGNOSIS — Z0000 Encounter for general adult medical examination without abnormal findings: Secondary | ICD-10-CM

## 2021-05-25 DIAGNOSIS — Z0001 Encounter for general adult medical examination with abnormal findings: Secondary | ICD-10-CM | POA: Diagnosis not present

## 2021-05-25 DIAGNOSIS — I10 Essential (primary) hypertension: Secondary | ICD-10-CM

## 2021-05-25 DIAGNOSIS — R5383 Other fatigue: Secondary | ICD-10-CM | POA: Diagnosis not present

## 2021-05-25 NOTE — Progress Notes (Signed)
Established patient visit ? ? ?Patient: Eddie Murray   DOB: 25-May-1985   36 y.o. Male  MRN: [id number] ?Visit Date: 05/25/2021 ? ? ?Chief Complaint  ?Patient presents with  ? Annual Exam  ? ?Subjective  ?  ?HPI  ?The patient is here for annual wellness visit.  ?-due to have routine, fasting labs  ?-feels like he is "deficient" in something.  ?-increased oil production on the face.  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  ?-has been on victoza then ozempic since he started as a patient here. He has been getting at least 10000 steps per day. Recently restarted victoza. Has lost 42 pounds since December. Would like to see if there is something else to get him past this plateau he has reached.  ? ? ?Medications: ?Outpatient Medications Prior to Visit  ?Medication Sig  ? Aspirin-Acetaminophen-Caffeine (GOODY HEADACHE PO) Take 1 packet by mouth daily as needed (pain).  ? baclofen (LIORESAL) 10 MG tablet Take 10 mg by mouth daily as needed for muscle spasms.  ? Carboxymethylcellul-Glycerin (LUBRICATING EYE DROPS OP) Place 1 drop into both eyes daily as needed (dry eyes).  ? clonazePAM (KLONOPIN) 0.5 MG tablet Take 0.5 mg by mouth 2 (two) times daily as needed for anxiety.  ? Cyanocobalamin (B-12 PO) Take 1 Dose by mouth daily as needed (energy). Liquid  ? Digestive Enzyme CAPS Take 2 capsules by mouth daily.  ? Ferrous Sulfate (IRON PO) Take 1 tablet by mouth daily as needed (feeling tired).  ? MAGNESIUM PO Take 1 tablet by mouth at bedtime.  ? omeprazole (PRILOSEC) 40 MG capsule Take 40 mg by mouth daily as needed for heartburn.  ? propranolol ER (INDERAL LA) 60 MG 24 hr capsule Take 60 mg by mouth daily.  ? Semaglutide,0.25 or 0.5MG /DOS, (OZEMPIC, 0.25 OR 0.5 MG/DOSE,) 2 MG/1.5ML SOPN Inject 0.5 mg into the skin once a week.  ? Vitamin D-Vitamin K (VITAMIN K2-VITAMIN D3 PO) Take 1 capsule by mouth daily.  ? [DISCONTINUED] VICTOZA 18 MG/3ML SOPN Inject into the skin.  ? ?No facility-administered  medications prior to visit.  ? ? ?Review of Systems  ?Constitutional:  Positive for fatigue. Negative for activity change, chills and fever.  ?     Has lost 42 pounds since 02/2021.   ?HENT:  Negative for congestion, postnasal drip, rhinorrhea, sinus pressure, sinus pain, sneezing and sore throat.   ?Eyes: Negative.   ?Respiratory:  Negative for cough, shortness of breath and wheezing.   ?Cardiovascular:  Negative for chest pain and palpitations.  ?Gastrointestinal:  Negative for constipation, diarrhea, nausea and vomiting.  ?Endocrine: Negative for cold intolerance, heat intolerance, polydipsia and polyuria.  ?     History of hashimoto's thyroiditis  ?Genitourinary:  Negative for dysuria, frequency and urgency.  ?Musculoskeletal:  Negative for back pain and myalgias.  ?Skin:  Negative for rash.  ?Allergic/Immunologic: Negative for environmental allergies.  ?Neurological:  Negative for dizziness, weakness and headaches.  ?Psychiatric/Behavioral:  The patient is not nervous/anxious.   ?     Sees psychiatry   ? ? ? Objective  ?  ? ?Today's Vitals  ? 05/25/21 0923  ?BP: 111/70  ?Pulse: 60  ?Temp: 97.6 ?F (36.4 ?C)  ?SpO2: 99%  ?Weight: (!) 352 lb 4.8 oz (159.8 kg)  ?Height: 5' 6.93" (1.7 m)  ? ?Body mass index is 55.3 kg/m?.  ? ?BP Readings from Last 3 Encounters:  ?05/25/21 111/70  ?04/12/21 113/70  ?03/09/21 139/75  ?  ?Wt Readings from Last  3 Encounters:  ?05/25/21 (!) 352 lb 4.8 oz (159.8 kg)  ?04/12/21 (!) 363 lb 6.4 oz (164.8 kg)  ?03/09/21 (!) 393 lb 8.3 oz (178.5 kg)  ?  ?Physical Exam ?Vitals and nursing note reviewed.  ?Constitutional:   ?   Appearance: Normal appearance. He is well-developed. He is obese.  ?HENT:  ?   Head: Normocephalic and atraumatic.  ?   Right Ear: Tympanic membrane, ear canal and external ear normal.  ?   Left Ear: Tympanic membrane, ear canal and external ear normal.  ?   Nose: Nose normal.  ?   Mouth/Throat:  ?   Mouth: Mucous membranes are moist.  ?   Pharynx: Oropharynx is clear.   ?Eyes:  ?   Extraocular Movements: Extraocular movements intact.  ?   Conjunctiva/sclera: Conjunctivae normal.  ?   Pupils: Pupils are equal, round, and reactive to light.  ?Cardiovascular:  ?   Rate and Rhythm: Normal rate and regular rhythm.  ?   Pulses: Normal pulses.  ?   Heart sounds: Normal heart sounds.  ?Pulmonary:  ?   Effort: Pulmonary effort is normal.  ?   Breath sounds: Normal breath sounds.  ?Abdominal:  ?   General: Bowel sounds are normal. There is no distension.  ?   Palpations: Abdomen is soft. There is no mass.  ?   Tenderness: There is no abdominal tenderness. There is no guarding or rebound.  ?   Hernia: No hernia is present.  ?Musculoskeletal:     ?   General: Normal range of motion.  ?   Cervical back: Normal range of motion and neck supple.  ?Lymphadenopathy:  ?   Cervical: No cervical adenopathy.  ?Skin: ?   General: Skin is warm and dry.  ?   Capillary Refill: Capillary refill takes less than 2 seconds.  ?Neurological:  ?   General: No focal deficit present.  ?   Mental Status: He is alert and oriented to person, place, and time.  ?Psychiatric:     ?   Mood and Affect: Mood normal.     ?   Behavior: Behavior normal.     ?   Thought Content: Thought content normal.     ?   Judgment: Judgment normal.  ?  ? ? Assessment & Plan  ?  ?1. Encounter for general adult medical examination with abnormal findings ?Annual wellness visit. Routine, fasting labs drawn during today's visit.  ? ?2. Essential hypertension ?Stable. Continue BP medication as prescribed  ? ?3. Hypothyroidism, unspecified type ?Check thyroid panel along with thyroid auto antibodies for further evaluation.  ?- TSH; Future ?- T3; Future ?- T4, free; Future ?- Thyroid peroxidase antibody; Future ?- Thyroglobulin antibody; Future ?- Cortisol; Future ?- T3 ?- TSH ?- T4, free ?- Thyroid peroxidase antibody ?- Thyroglobulin antibody ?- Cortisol ? ? ?4. Other fatigue ?Check thyroid and anemia panels and vitamin d for further evaluation   ?- Vitamin D (25 hydroxy); Future ?- T3; Future ?- T4, free; Future ?- Vitamin B12; Future ?- Vitamin D (25 hydroxy) ?- T3 ?- T4, free ?- Vitamin B12 ? ?5. Body mass index (BMI) of 50-59.9 in adult [facility]) ?Encourage patient to limit calorie intake to 2000 cal/day or less.  He should consume a low cholesterol, low-fat diet.  Patient has lost 41 pounds over last several months. Will try to continue ozempic 0.5mg  weekly. New prescription sent to his pharmacy today.  ? ?6. Healthcare maintenance ?Routine, fasting labs drawn  during today's visit.  ?- Comp Met (CMET); Future ?- CBC; Future ?- Lipid panel; Future ?- Hemoglobin A1c; Future ?- TSH; Future ?- Comp Met (CMET) ?- CBC ?- Lipid panel ?- Hemoglobin A1c ?- TSH  ? ? ?Problem List Items Addressed This Visit   ? ?  ? Cardiovascular and Mediastinum  ? Essential hypertension  ?  ? Endocrine  ? Hypothyroidism  ? Relevant Orders  ? TSH (Completed)  ? T3 (Completed)  ? T4, free (Completed)  ? Thyroid peroxidase antibody (Completed)  ? Thyroglobulin antibody (Completed)  ? Cortisol (Completed)  ?  ? Other  ? Body mass index (BMI) of 60.0-69.9 in adult [facility])  ? Relevant Orders  ? Comp Met (CMET) (Completed)  ? CBC (Completed)  ? Lipid panel (Completed)  ? Hemoglobin A1c (Completed)  ? TSH (Completed)  ? Vitamin D (25 hydroxy) (Completed)  ? T3 (Completed)  ? T4, free (Completed)  ? Thyroid peroxidase antibody (Completed)  ? Thyroglobulin antibody (Completed)  ? Vitamin B12 (Completed)  ? Ferritin (Completed)  ? Folate (Completed)  ? Cortisol (Completed)  ? Body mass index (BMI) of 50-59.9 in adult [facility])  ? Other fatigue  ? Relevant Orders  ? Vitamin D (25 hydroxy) (Completed)  ? T3 (Completed)  ? T4, free (Completed)  ? Vitamin B12 (Completed)  ? ?Other Visit Diagnoses   ? ? Encounter for general adult medical examination with abnormal findings    -  Primary  ? Healthcare maintenance      ? Relevant Orders  ? Comp Met (CMET) (Completed)  ? CBC (Completed)  ? Lipid panel  (Completed)  ? Hemoglobin A1c (Completed)  ? TSH (Completed)  ? ?  ?  ? ?Return in about 3 months (around 08/25/2021) for follow up.  ?   ? ? ? ? ?Ronnell Freshwater, NP  ?[facility] ?[place]

## 2021-05-25 NOTE — Patient Instructions (Addendum)
Fat and Cholesterol Restricted Eating Plan ?Getting too much fat and cholesterol in your diet may cause health problems. Choosing the right foods helps keep your fat and cholesterol at normal levels. This can keep you from getting certain diseases. ?Your doctor may recommend an eating plan that includes: ?Total fat: ______% or less of total calories a day. This is ______g of fat a day. ?Saturated fat: ______% or less of total calories a day. This is ______g of saturated fat a day. ?Cholesterol: less than _________mg a day. ?Fiber: ______g a day. ?What are tips for following this plan? ?General tips ?Work with your doctor to lose weight if you need to. ?Avoid: ?Foods with added sugar. ?Fried foods. ?Foods with trans fat or partially hydrogenated oils. This includes some margarines and baked goods. ?If you drink alcohol: ?Limit how much you have to: ?0-1 drink a day for women who are not pregnant. ?0-2 drinks a day for men. ?Know how much alcohol is in a drink. In the [place] one drink equals one 12 oz bottle of beer (355 mL), one 5 oz glass of wine (148 mL), or one 1? oz glass of hard liquor (44 mL). ?Reading food labels ?Check food labels for: ?Trans fats. ?Partially hydrogenated oils. ?Saturated fat (g) in each serving. ?Cholesterol (mg) in each serving. ?Fiber (g) in each serving. ?Choose foods with healthy fats, such as: ?Monounsaturated fats and polyunsaturated fats. These include olive and canola oil, flaxseeds, walnuts, almonds, and seeds. ?Omega-3 fats. These are found in certain fish, flaxseed oil, and ground flaxseeds. ?Choose grain products that have whole grains. Look for the word "whole" as the first word in the ingredient list. ?Cooking ?Cook foods using low-fat methods. These include baking, boiling, grilling, and broiling. ?Eat more home-cooked foods. Eat at restaurants and buffets less often. Eat less fast food. ?Avoid cooking using saturated fats, such as butter, cream, palm oil, palm kernel oil, and  coconut oil. ?Meal planning ? ?At meals, divide your plate into four equal parts: ?Fill one-half of your plate with vegetables, green salads, and fruit. ?Fill one-fourth of your plate with whole grains. ?Fill one-fourth of your plate with low-fat (lean) protein foods. ?Eat fish that is high in omega-3 fats at least two times a week. This includes mackerel, tuna, sardines, and salmon. ?Eat foods that are high in fiber, such as whole grains, beans, apples, pears, berries, broccoli, carrots, peas, and barley. ?What foods should I eat? ?Fruits ?All fresh, canned (in natural juice), or frozen fruits. ?Vegetables ?Fresh or frozen vegetables (raw, steamed, roasted, or grilled). Green salads. ?Grains ?Whole grains, such as whole wheat or whole grain breads, crackers, cereals, and pasta. Unsweetened oatmeal, bulgur, barley, quinoa, or brown rice. Corn or whole wheat flour tortillas. ?Meats and other protein foods ?Ground beef (85% or leaner), grass-fed beef, or beef trimmed of fat. Skinless chicken or [place]. Ground chicken or [place]. Pork trimmed of fat. All fish and seafood. Egg whites. Dried beans, peas, or lentils. Unsalted nuts or seeds. Unsalted canned beans. Nut butters without added sugar or oil. ?Dairy ?Low-fat or nonfat dairy products, such as skim or 1% milk, 2% or reduced-fat cheeses, low-fat and fat-free ricotta or cottage cheese, or plain low-fat and nonfat yogurt. ?Fats and oils ?Tub margarine without trans fats. Light or reduced-fat mayonnaise and salad dressings. Avocado. Olive, canola, sesame, or safflower oils. ?The items listed above may not be a complete list of foods and beverages you can eat. Contact a dietitian for more information. ?What foods  should I avoid? ?Fruits ?Canned fruit in heavy syrup. Fruit in cream or butter sauce. Fried fruit. ?Vegetables ?Vegetables cooked in cheese, cream, or butter sauce. Fried vegetables. ?Grains ?White bread. White pasta. White rice. Cornbread. Bagels, pastries,  and croissants. Crackers and snack foods that contain trans fat and hydrogenated oils. ?Meats and other protein foods ?Fatty cuts of meat. Ribs, chicken wings, bacon, sausage, bologna, salami, chitterlings, fatback, hot dogs, bratwurst, and packaged lunch meats. Liver and organ meats. Whole eggs and egg yolks. Chicken and [place] with skin. Fried meat. ?Dairy ?Whole or 2% milk, cream, half-and-half, and cream cheese. Whole milk cheeses. Whole-fat or sweetened yogurt. Full-fat cheeses. Nondairy creamers and whipped toppings. Processed cheese, cheese spreads, and cheese curds. ?Fats and oils ?Butter, stick margarine, lard, shortening, ghee, or bacon fat. Coconut, palm kernel, and palm oils. ?Beverages ?Alcohol. Sugar-sweetened drinks such as sodas, lemonade, and fruit drinks. ?Sweets and desserts ?Corn syrup, sugars, honey, and molasses. Candy. Jam and jelly. Syrup. Sweetened cereals. Cookies, pies, cakes, donuts, muffins, and ice cream. ?The items listed above may not be a complete list of foods and beverages you should avoid. Contact a dietitian for more information. ?Summary ?Choosing the right foods helps keep your fat and cholesterol at normal levels. This can keep you from getting certain diseases. ?At meals, fill one-half of your plate with vegetables, green salads, and fruits. ?Eat high fiber foods, like whole grains, beans, apples, pears, berries, carrots, peas, and barley. ?Limit added sugar, saturated fats, alcohol, and fried foods. ?This information is not intended to replace advice given to you by your health care provider. Make sure you discuss any questions you have with your health care provider. ?Document Revised: 07/08/2020 Document Reviewed: 07/08/2020 ?Elsevier Patient Education ? [place]. ? ?Fat and Cholesterol Restricted Eating Plan ?Getting too much fat and cholesterol in your diet may cause health problems. Choosing the right foods helps keep your fat and cholesterol at normal levels.  This can keep you from getting certain diseases. ?Your doctor may recommend an eating plan that includes: ?Total fat: ______% or less of total calories a day. This is ______g of fat a day. ?Saturated fat: ______% or less of total calories a day. This is ______g of saturated fat a day. ?Cholesterol: less than _________mg a day. ?Fiber: ______g a day. ?What are tips for following this plan? ?General tips ?Work with your doctor to lose weight if you need to. ?Avoid: ?Foods with added sugar. ?Fried foods. ?Foods with trans fat or partially hydrogenated oils. This includes some margarines and baked goods. ?If you drink alcohol: ?Limit how much you have to: ?0-1 drink a day for women who are not pregnant. ?0-2 drinks a day for men. ?Know how much alcohol is in a drink. In the [place] one drink equals one 12 oz bottle of beer (355 mL), one 5 oz glass of wine (148 mL), or one 1? oz glass of hard liquor (44 mL). ?Reading food labels ?Check food labels for: ?Trans fats. ?Partially hydrogenated oils. ?Saturated fat (g) in each serving. ?Cholesterol (mg) in each serving. ?Fiber (g) in each serving. ?Choose foods with healthy fats, such as: ?Monounsaturated fats and polyunsaturated fats. These include olive and canola oil, flaxseeds, walnuts, almonds, and seeds. ?Omega-3 fats. These are found in certain fish, flaxseed oil, and ground flaxseeds. ?Choose grain products that have whole grains. Look for the word "whole" as the first word in the ingredient list. ?Cooking ?Cook foods using low-fat methods. These include baking, boiling, grilling,  and broiling. ?Eat more home-cooked foods. Eat at restaurants and buffets less often. Eat less fast food. ?Avoid cooking using saturated fats, such as butter, cream, palm oil, palm kernel oil, and coconut oil. ?Meal planning ? ?At meals, divide your plate into four equal parts: ?Fill one-half of your plate with vegetables, green salads, and fruit. ?Fill one-fourth of your plate with whole  grains. ?Fill one-fourth of your plate with low-fat (lean) protein foods. ?Eat fish that is high in omega-3 fats at least two times a week. This includes mackerel, tuna, sardines, and salmon. ?Eat foods that

## 2021-05-26 LAB — LIPID PANEL
Chol/HDL Ratio: 4.2 ratio (ref 0.0–5.0)
Cholesterol, Total: 169 mg/dL (ref 100–199)
HDL: 40 mg/dL (ref >39)
LDL Chol Calc (NIH): 115 mg/dL — ABNORMAL HIGH (ref 0–99)
Triglycerides: 75 mg/dL (ref 0–149)
VLDL Cholesterol Cal: 14 mg/dL (ref 5–40)

## 2021-05-26 LAB — COMPREHENSIVE METABOLIC PANEL
ALT: 29 IU/L (ref 0–44)
AST: 36 IU/L (ref 0–40)
Albumin/Globulin Ratio: 1.4 (ref 1.2–2.2)
Albumin: 4.5 g/dL (ref 4.0–5.0)
Alkaline Phosphatase: 74 IU/L (ref 44–121)
BUN/Creatinine Ratio: 18 (ref 9–20)
BUN: 15 mg/dL (ref 6–20)
Bilirubin Total: 0.7 mg/dL (ref 0.0–1.2)
CO2: 23 mmol/L (ref 20–29)
Calcium: 9.4 mg/dL (ref 8.7–10.2)
Chloride: 101 mmol/L (ref 96–106)
Creatinine, Ser: 0.83 mg/dL (ref 0.76–1.27)
Globulin, Total: 3.2 g/dL (ref 1.5–4.5)
Glucose: 75 mg/dL (ref 70–99)
Potassium: 4.6 mmol/L (ref 3.5–5.2)
Sodium: 137 mmol/L (ref 134–144)
Total Protein: 7.7 g/dL (ref 6.0–8.5)
eGFR: 119 mL/min/{1.73_m2} (ref >59)

## 2021-05-26 LAB — CBC
Hematocrit: 47 % (ref 37.5–51.0)
Hemoglobin: 16.1 g/dL (ref 13.0–17.7)
MCH: 29.4 pg (ref 26.6–33.0)
MCHC: 34.3 g/dL (ref 31.5–35.7)
MCV: 86 fL (ref 79–97)
Platelets: 196 10*3/uL (ref 150–450)
RBC: 5.48 x10E6/uL (ref 4.14–5.80)
RDW: 12.3 % (ref 11.6–15.4)
WBC: 4.7 10*3/uL (ref 3.4–10.8)

## 2021-05-26 LAB — THYROGLOBULIN ANTIBODY: Thyroglobulin Antibody: 35.5 IU/mL — ABNORMAL HIGH (ref 0.0–0.9)

## 2021-05-26 LAB — THYROID PEROXIDASE ANTIBODY: Thyroperoxidase Ab SerPl-aCnc: 23 IU/mL (ref 0–34)

## 2021-05-26 LAB — HEMOGLOBIN A1C
Est. average glucose Bld gHb Est-mCnc: 94 mg/dL
Hgb A1c MFr Bld: 4.9 % (ref 4.8–5.6)

## 2021-05-26 LAB — VITAMIN D 25 HYDROXY (VIT D DEFICIENCY, FRACTURES): Vit D, 25-Hydroxy: 39.8 ng/mL (ref 30.0–100.0)

## 2021-05-26 LAB — CORTISOL: Cortisol: 5.2 ug/dL

## 2021-05-26 LAB — TSH: TSH: 1.51 u[IU]/mL (ref 0.450–4.500)

## 2021-05-26 LAB — VITAMIN B12: Vitamin B-12: 731 pg/mL (ref 232–1245)

## 2021-05-26 LAB — FOLATE: Folate: 3.8 ng/mL (ref >3.0)

## 2021-05-26 LAB — FERRITIN: Ferritin: 181 ng/mL (ref 30–400)

## 2021-05-26 LAB — T3: T3, Total: 117 ng/dL (ref 71–180)

## 2021-05-26 LAB — T4, FREE: Free T4: 1.25 ng/dL (ref 0.82–1.77)

## 2021-06-04 DIAGNOSIS — R5383 Other fatigue: Secondary | ICD-10-CM | POA: Insufficient documentation

## 2021-06-04 DIAGNOSIS — E039 Hypothyroidism, unspecified: Secondary | ICD-10-CM | POA: Insufficient documentation

## 2021-06-20 ENCOUNTER — Other Ambulatory Visit: Payer: Self-pay | Admitting: Nurse Practitioner

## 2021-06-20 DIAGNOSIS — R69 Illness, unspecified: Secondary | ICD-10-CM | POA: Diagnosis not present

## 2021-06-20 DIAGNOSIS — R635 Abnormal weight gain: Secondary | ICD-10-CM | POA: Diagnosis not present

## 2021-06-20 DIAGNOSIS — E8881 Metabolic syndrome: Secondary | ICD-10-CM | POA: Diagnosis not present

## 2021-06-20 DIAGNOSIS — M25572 Pain in left ankle and joints of left foot: Secondary | ICD-10-CM | POA: Diagnosis not present

## 2021-06-20 DIAGNOSIS — I10 Essential (primary) hypertension: Secondary | ICD-10-CM | POA: Diagnosis not present

## 2021-07-18 DIAGNOSIS — M25572 Pain in left ankle and joints of left foot: Secondary | ICD-10-CM | POA: Diagnosis not present

## 2021-07-18 DIAGNOSIS — M7121 Synovial cyst of popliteal space [Baker], right knee: Secondary | ICD-10-CM | POA: Diagnosis not present

## 2021-07-18 DIAGNOSIS — I10 Essential (primary) hypertension: Secondary | ICD-10-CM | POA: Diagnosis not present

## 2021-07-18 DIAGNOSIS — E8881 Metabolic syndrome: Secondary | ICD-10-CM | POA: Diagnosis not present

## 2021-08-15 DIAGNOSIS — Z13220 Encounter for screening for lipoid disorders: Secondary | ICD-10-CM | POA: Diagnosis not present

## 2021-08-15 DIAGNOSIS — Z1329 Encounter for screening for other suspected endocrine disorder: Secondary | ICD-10-CM | POA: Diagnosis not present

## 2021-08-15 DIAGNOSIS — I10 Essential (primary) hypertension: Secondary | ICD-10-CM | POA: Diagnosis not present

## 2021-08-15 DIAGNOSIS — E8881 Metabolic syndrome: Secondary | ICD-10-CM | POA: Diagnosis not present

## 2021-08-20 NOTE — Progress Notes (Signed)
Overall, labs good. Mild elevation of LDL and thyroid auto-antibodies. Discuss with patient at 2021-09-21

## 2021-08-29 DIAGNOSIS — E063 Autoimmune thyroiditis: Secondary | ICD-10-CM | POA: Diagnosis not present

## 2021-08-29 DIAGNOSIS — M7121 Synovial cyst of popliteal space [Baker], right knee: Secondary | ICD-10-CM | POA: Diagnosis not present

## 2021-08-31 ENCOUNTER — Ambulatory Visit: Payer: 59 | Admitting: Nurse Practitioner

## 2021-09-04 DIAGNOSIS — M7631 Iliotibial band syndrome, right leg: Secondary | ICD-10-CM | POA: Diagnosis not present

## 2021-09-04 DIAGNOSIS — M25561 Pain in right knee: Secondary | ICD-10-CM | POA: Diagnosis not present

## 2021-09-06 DIAGNOSIS — M25561 Pain in right knee: Secondary | ICD-10-CM | POA: Diagnosis not present

## 2021-09-07 DIAGNOSIS — K219 Gastro-esophageal reflux disease without esophagitis: Secondary | ICD-10-CM | POA: Diagnosis not present

## 2021-09-07 DIAGNOSIS — E669 Obesity, unspecified: Secondary | ICD-10-CM | POA: Diagnosis not present

## 2021-09-07 DIAGNOSIS — M792 Neuralgia and neuritis, unspecified: Secondary | ICD-10-CM | POA: Diagnosis not present

## 2021-09-26 ENCOUNTER — Encounter: Payer: Self-pay | Admitting: Neurology

## 2021-12-01 NOTE — Progress Notes (Deleted)
Initial neurology clinic note  SERVICE DATE: 12/06/21 SERVICE TIME: 8:00 am  Reason for Evaluation: Consultation requested by Bobbye Riggs, FNP for an opinion regarding ***. My final recommendations will be communicated back to the requesting physician by way of shared medical record or letter to requesting physician via [place] mail.  HPI: This is Eddie Murray, a 36 y.o. ***-handed male with a medical history of HTN, hypothyroidism, obesity, anxiety*** who presents to neurology clinic with the chief complaint of ***. The patient is accompanied by ***.  ***  Patient was previously seen by neurology ([place], [place]) for paresthesias in 2019 and 2020. He described left arm and leg weakness and burning in left foot that would come and go.  The patient has not*** had similar episodes of symptoms in the past. *** Muscle bulk loss? *** Muscle pain? *** Cramps/Twitching? *** Suggestion of myotonia/difficulty relaxing after contraction? *** Fatigable weakness?*** Does strength improve after brief exercise?*** Able to brush hair/teeth without difficulty? *** Able to button shirts/use zips? *** Clumsiness/dropping grasped objects?*** Can you arise from squatted position easily? *** Able to get out of chair without using arms? *** Able to walk up steps easily? *** Use an assistive device to walk? *** Significant imbalance with walking? *** Falls?*** Any change in urine color, especially after exertion/physical activity? ***  The patient denies*** symptoms suggestive of oculobulbar weakness including diplopia, ptosis, dysphagia, poor saliva control, dysarthria/dysphonia, impaired mastication, facial weakness/droop.  There are no*** neuromuscular respiratory weakness symptoms, particularly orthopnea>dyspnea. Pseudobulbar affect is absent***. The patient does not*** report symptoms referable to autonomic dysfunction including impaired sweating, heat or cold  intolerance, excessive mucosal dryness, gastroparetic early satiety, postprandial abdominal bloating, constipation, bowel or bladder dyscontrol, erectile dysfunction*** or syncope/presyncope/orthostatic intolerance.  There are no*** complaints relating to other symptoms of small fiber modalities including paresthesia/pain.  The patient has not *** noticed any recent skin rashes nor does he*** report any constitutional symptoms like fever, night sweats, anorexia or unintentional weight loss.  EtOH use: ***  Restrictive diet? *** Family history of neuropathy/myopathy/NM disease?***  Previous labs, electrodiagnostics, and neuroimaging are summarized below, but pertinent findings include***  Any biopsy done? *** Current medications being tried for the patient's symptoms include ***  Prior medications that have been tried: ***   MEDICATIONS:  Outpatient Encounter Medications as of 12/06/2021  Medication Sig   Aspirin-Acetaminophen-Caffeine (GOODY HEADACHE PO) Take 1 packet by mouth daily as needed (pain).   baclofen (LIORESAL) 10 MG tablet Take 10 mg by mouth daily as needed for muscle spasms.   Carboxymethylcellul-Glycerin (LUBRICATING EYE DROPS OP) Place 1 drop into both eyes daily as needed (dry eyes).   clonazePAM (KLONOPIN) 0.5 MG tablet Take 0.5 mg by mouth 2 (two) times daily as needed for anxiety.   Cyanocobalamin (B-12 PO) Take 1 Dose by mouth daily as needed (energy). Liquid   Digestive Enzyme CAPS Take 2 capsules by mouth daily.   Ferrous Sulfate (IRON PO) Take 1 tablet by mouth daily as needed (feeling tired).   MAGNESIUM PO Take 1 tablet by mouth at bedtime.   omeprazole (PRILOSEC) 40 MG capsule Take 40 mg by mouth daily as needed for heartburn.   propranolol ER (INDERAL LA) 60 MG 24 hr capsule Take 60 mg by mouth daily.   Semaglutide,0.25 or 0.'5MG'$ /DOS, (OZEMPIC, 0.25 OR 0.5 MG/DOSE,) 2 MG/1.5ML SOPN Inject 0.5 mg into the skin once a week.   Vitamin D-Vitamin K (VITAMIN  K2-VITAMIN D3 PO) Take  1 capsule by mouth daily.   No facility-administered encounter medications on file as of 12/06/2021.    PAST MEDICAL HISTORY: Past Medical History:  Diagnosis Date   ADD (attention deficit disorder)    Allergies    Anemia    Anxiety disorder    Arthritis    Asthma    Bladder problem    Bleeding disorder ([place])    CAD (coronary artery disease)    Cancer (HCC)    Congenital abnormalities    Constipation    COPD (chronic obstructive pulmonary disease) ([place])    Depression    Diabetes ([place])    Dialysis patient ([place])    Diverticulitis    DVT (deep venous thrombosis) (HCC)    Ear problems    Eczema    ED (erectile dysfunction)    Fibromyalgia    GERD (gastroesophageal reflux disease)    Gout    Heart attack ([place])    Heart disease    High cholesterol    Hypertension    Hyperthyroidism    Hypogonadism in male    Hypothyroidism    Joint pain    Kidney disease    Kidney stone    Leg ulcer ([place])    Liver disease    Obesity    OSA (obstructive sleep apnea)    Osteoporosis    Pacemaker    Pulmonary embolism ([place])    REM behavioral disorder    Seizures ([place])    Skin disorder    Stroke [facility])    Thyroid disorder    Tuberculosis     PAST SURGICAL HISTORY: Past Surgical History:  Procedure Laterality Date   BIOPSY  03/09/2021   Procedure: BIOPSY;  Surgeon: Lavena Bullion, DO;  [facility];  Service: Gastroenterology;;  EGD and COLON   COLONOSCOPY WITH PROPOFOL N/A 03/09/2021   Procedure: COLONOSCOPY WITH PROPOFOL;  Surgeon: Lavena Bullion, DO;  [facility];  Service: Gastroenterology;  Laterality: N/A;   ESOPHAGOGASTRODUODENOSCOPY (EGD) WITH PROPOFOL N/A 03/09/2021   Procedure: ESOPHAGOGASTRODUODENOSCOPY (EGD) WITH PROPOFOL;  Surgeon: Lavena Bullion, DO;  [facility];  Service: Gastroenterology;  Laterality: N/A;   INSERT / REPLACE / REMOVE PACEMAKER      ALLERGIES: No Known Allergies  FAMILY  HISTORY: Family History  Problem Relation Age of Onset   Thyroid disease Mother    Hypertension Mother    Diabetes Father    Hypertension Father    Thyroid disease Maternal Grandfather    Other Maternal Grandfather        malignant tumor of the lung    SOCIAL HISTORY: Social History   Tobacco Use   Smoking status: Former    Types: Cigarettes, Cigars    Quit date: 2017    Years since quitting: 6.7   Smokeless tobacco: Never  Vaping Use   Vaping Use: Never used  Substance Use Topics   Alcohol use: Never   Drug use: Never   Social History   Social History Narrative   Not on file     OBJECTIVE: PHYSICAL EXAM: There were no vitals taken for this visit.  General:*** General appearance: Awake and alert. No distress. Cooperative with exam.  Skin: No obvious rash or jaundice. HEENT: Atraumatic. Anicteric. Lungs: Non-labored breathing on room air  Heart: Regular Abdomen: Soft, non tender. Extremities: No edema. No obvious deformity.  Musculoskeletal: No obvious joint swelling. Psych: Affect appropriate.  Neurological: Mental Status: Alert. Speech fluent. No pseudobulbar affect Cranial Nerves: CNII: No RAPD. Visual  fields intact. CNIII, IV, VI: PERRL. No nystagmus. EOMI. CN V: Facial sensation intact bilaterally to fine touch. [place] clench strong. Jaw jerk***. CN VII: Facial muscles symmetric and strong. No ptosis at rest or after sustained upgaze***. CN VIII: Hears finger rub well bilaterally. CN IX: No hypophonia. CN X: Palate elevates symmetrically. CN XI: Full strength shoulder shrug bilaterally. CN XII: Tongue protrusion full and midline. No atrophy or fasciculations. No significant dysarthria*** Motor: Tone is ***. *** fasciculations in *** extremities. *** atrophy. No grip or percussive myotonia.  Individual muscle group testing (MRC grade out of 5):  Movement     Neck flexion ***    Neck extension ***     Right Left   Shoulder abduction ***  ***   Shoulder adduction *** ***   Shoulder ext rotation *** ***   Shoulder int rotation *** ***   Elbow flexion *** ***   Elbow extension *** ***   Wrist extension *** ***   Wrist flexion *** ***   Finger abduction - FDI *** ***   Finger abduction - ADM *** ***   Finger extension *** ***   Finger distal flexion - 2/3 *** ***   Finger distal flexion - 4/5 *** ***   Thumb flexion - FPL *** ***   Thumb abduction - APB *** ***    Hip flexion *** ***   Hip extension *** ***   Hip adduction *** ***   Hip abduction *** ***   Knee extension *** ***   Knee flexion *** ***   Dorsiflexion *** ***   Plantarflexion *** ***   Inversion *** ***   Eversion *** ***   Great toe extension *** ***   Great toe flexion *** ***     Reflexes:  Right Left   Bicep *** ***   Tricep *** ***   BrRad *** ***   Knee *** ***   Ankle *** ***    Pathological Reflexes: Babinski: *** response bilaterally*** Hoffman: *** Troemner: *** Pectoral: *** Palmomental: *** Facial: *** Midline tap: *** Sensation: Pinprick: *** Vibration: *** Temperature: *** Proprioception: *** Coordination: Intact finger-to- nose-finger bilaterally. Romberg negative.*** Gait: Able to rise from chair with arms crossed unassisted. Normal, narrow-based gait. Able to tandem walk. Able to walk on toes and heels.***  Lab and Test Review: Internal labs: Normal or unremarkable: ferritin, folate, TSH, vit D, CBC, CMP B12 (05/25/21): 731 HbA1c (05/25/21): 4.9  External labs: ***  Duodenal, ileum, and stomach biopsy (03/09/21): Negative for features of celiac disease  MRI lumbar spine (04/28/2018): FINDINGS: Segmentation:  Standard.   Alignment:  Physiologic.   Vertebrae: Vertebral body heights maintained without evidence for acute or chronic fracture. Bone marrow signal intensity diffusely decreased on T1 weighted imaging, most commonly seen with anemia, smoking, or obesity. No discrete or worrisome osseous lesions.  No abnormal marrow edema.   Conus medullaris and cauda equina: Conus extends to the L1 level. Conus and cauda equina appear normal.   Paraspinal and other soft tissues: Paraspinous soft tissues within normal limits. Visualized visceral structures unremarkable.   Disc levels:   T11-12: Left paracentral disc protrusion indents the left ventral thecal sac. Slight superior migration. Mild spinal stenosis without significant cord deformity. Foramina remain patent.   T12-L1: Unremarkable.   L1-2: Mild annular disc bulge with disc desiccation and intervertebral disc space narrowing. No stenosis or impingement.   L2-3: Mild annular disc bulge with disc desiccation and intervertebral disc space narrowing. No stenosis or impingement.  L3-4: Mild annular disc bulge with disc desiccation and intervertebral disc space narrowing. No stenosis or impingement.   L4-5: Normal interspace. Mild bilateral facet hypertrophy. No significant stenosis or impingement.   L5-S1: Normal interspace. Mild facet hypertrophy. No stenosis or impingement.   IMPRESSION: 1. Left paracentral disc protrusion at T12-L1 with resultant mild spinal stenosis without cord deformity. 2. Mild degenerative disc bulging at L1-2 through L3-4 without significant stenosis or neural impingement. 3. Mild facet hypertrophy at L4-5 and L5-S1. ***  ASSESSMENT: Gillis Boardley is a 36 y.o. male who presents for evaluation of ***. *** has a relevant medical history of ***. *** neurological examination is pertinent for ***. Available diagnostic data is significant for ***. This constellation of symptoms and objective data would most likely localize to ***. ***  PLAN: -Blood work: *** ***  -Return to clinic ***  The impression above as well as the plan as outlined below were extensively discussed with the patient (in the company of ***) who voiced understanding. All questions were answered to their satisfaction.  The patient  was counseled on pertinent fall precautions per the printed material provided today ***, and as noted under the "Patient Instructions" section below.  When available, results of the above investigations and possible further recommendations will be communicated to the patient via telephone/MyChart. Patient to call office if not contacted after expected testing turnaround time.   Total time spent reviewing records, interview, history/exam, documentation, and coordination of care on day of encounter:  *** min   Thank you for allowing me to participate in patient's care.  If I can answer any additional questions, I would be pleased to do so.  Kai Levins, MD   CC: Ronnell Freshwater, NP [place] [id number]  CC: Referring provider: Bobbye Riggs, [place],  [place] [address]

## 2021-12-06 ENCOUNTER — Encounter: Payer: Self-pay | Admitting: Neurology

## 2021-12-06 ENCOUNTER — Ambulatory Visit: Payer: 59 | Admitting: Neurology

## 2021-12-06 DIAGNOSIS — Z029 Encounter for administrative examinations, unspecified: Secondary | ICD-10-CM

## 2022-02-09 NOTE — Progress Notes (Unsigned)
Initial neurology clinic note  SERVICE DATE: 02/14/22  Reason for Evaluation: Consultation requested by Ronnell Freshwater, NP for an opinion regarding left sided numbness and tingling. My final recommendations will be communicated back to the requesting physician by way of shared medical record or letter to requesting physician via [place] mail.  HPI: This is Mr. Eddie Murray, a 36 y.o. right-handed male with a medical history of HTN, GERD, OSA, anxiety who presents to neurology clinic with the chief complaint of left sided numbness and tingling. The patient is alone today.  Patient has had left sided numbness and tingling since 2019. Symptoms came out of nowhere. It was in the summer. His entire left side went numb. He did not know if he was having a stroke so he went to the ED. His BP was very high. He was previously seen by [facility] (neurology). He thinks he has had some brain imaging. He had a MRI of his "back" but this was normal per patient. Patient was given gabapentin for about 2-3 months. This did not seem to help, so patient weaned off of it.  Patient has a lot of stomach problems and feels like his digestion is slow. He thinks there is a problem with his "vagus nerve". When he has nerve attacks, he has spasms in his hands. When he is anxious, the spasms can be in eye, arm, and leg. He has had about 10 "episodes" of nerve attacks this year. It was more previously. There is always a burning sensation in his left foot.  He gets tired eyes a few times per week. He has difficulty looking his phone. He denies difficulty with double vision or ptosis. He denies significantly dry eyes or mouth.  He started taking B12 this past year that helped.   Patient currently takes Klonopin occasionally for anxiety.  The patient has not noticed any recent skin rashes nor does he report any constitutional symptoms like fever, night sweats, anorexia or unintentional weight loss.  EtOH use: No   Restrictive diet? No Family history of neuropathy/myopathy/NM disease? No, but significant anxiety in mother and father. Mother dx with PD   MEDICATIONS:  Outpatient Encounter Medications as of 02/14/2022  Medication Sig   Aspirin-Acetaminophen-Caffeine (GOODY HEADACHE PO) Take 1 packet by mouth daily as needed (pain).   baclofen (LIORESAL) 10 MG tablet Take 10 mg by mouth daily as needed for muscle spasms.   Carboxymethylcellul-Glycerin (LUBRICATING EYE DROPS OP) Place 1 drop into both eyes daily as needed (dry eyes).   clonazePAM (KLONOPIN) 0.5 MG tablet Take 0.5 mg by mouth 2 (two) times daily as needed for anxiety.   Cyanocobalamin (B-12 PO) Take 1 Dose by mouth daily as needed (energy). Liquid   Digestive Enzyme CAPS Take 2 capsules by mouth daily.   Ferrous Sulfate (IRON PO) Take 1 tablet by mouth daily as needed (feeling tired).   MAGNESIUM PO Take 1 tablet by mouth at bedtime.   omeprazole (PRILOSEC) 40 MG capsule Take 40 mg by mouth daily as needed for heartburn.   propranolol ER (INDERAL LA) 60 MG 24 hr capsule Take 60 mg by mouth daily.   Vitamin D-Vitamin K (VITAMIN K2-VITAMIN D3 PO) Take 1 capsule by mouth daily.   Semaglutide,0.25 or 0.'5MG'$ /DOS, (OZEMPIC, 0.25 OR 0.5 MG/DOSE,) 2 MG/1.5ML SOPN Inject 0.5 mg into the skin once a week. (Patient not taking: Reported on 02/14/2022)   No facility-administered encounter medications on file as of 02/14/2022.    PAST MEDICAL  HISTORY: Past Medical History:  Diagnosis Date   ADD (attention deficit disorder)    Allergies    Anemia    Anxiety disorder    Arthritis    Asthma    Bladder problem    Bleeding disorder ([place])    CAD (coronary artery disease)    Cancer (HCC)    Congenital abnormalities    Constipation    COPD (chronic obstructive pulmonary disease) ([place])    Depression    Diabetes ([place])    Dialysis patient ([place])    Diverticulitis    DVT (deep venous thrombosis) ([place])    Ear problems    Eczema    ED (erectile  dysfunction)    Fibromyalgia    GERD (gastroesophageal reflux disease)    Gout    Heart attack ([place])    Heart disease    High cholesterol    Hypertension    Hyperthyroidism    Hypogonadism in male    Hypothyroidism    Joint pain    Kidney disease    Kidney stone    Leg ulcer ([place])    Liver disease    Obesity    OSA (obstructive sleep apnea)    Osteoporosis    Pacemaker    Pulmonary embolism ([place])    REM behavioral disorder    Seizures ([place])    Skin disorder    Stroke [facility])    Thyroid disorder    Tuberculosis     PAST SURGICAL HISTORY: Past Surgical History:  Procedure Laterality Date   BIOPSY  03/09/2021   Procedure: BIOPSY;  Surgeon: Lavena Bullion, DO;  [facility];  Service: Gastroenterology;;  EGD and COLON   COLONOSCOPY WITH PROPOFOL N/A 03/09/2021   Procedure: COLONOSCOPY WITH PROPOFOL;  Surgeon: Lavena Bullion, DO;  [facility];  Service: Gastroenterology;  Laterality: N/A;   ESOPHAGOGASTRODUODENOSCOPY (EGD) WITH PROPOFOL N/A 03/09/2021   Procedure: ESOPHAGOGASTRODUODENOSCOPY (EGD) WITH PROPOFOL;  Surgeon: Lavena Bullion, DO;  [facility];  Service: Gastroenterology;  Laterality: N/A;   INSERT / REPLACE / REMOVE PACEMAKER      ALLERGIES: No Known Allergies  FAMILY HISTORY: Family History  Problem Relation Age of Onset   Thyroid disease Mother    Hypertension Mother    Diabetes Father    Hypertension Father    Thyroid disease Maternal Grandfather    Other Maternal Grandfather        malignant tumor of the lung    SOCIAL HISTORY: Social History   Tobacco Use   Smoking status: Former    Types: Cigarettes, Cigars    Quit date: 2017    Years since quitting: 6.9   Smokeless tobacco: Never  Vaping Use   Vaping Use: Never used  Substance Use Topics   Alcohol use: Never   Drug use: Never   Social History   Social History Narrative   Are you right handed or left handed? right   Are you currently  employed ? 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    What type of home do you live in: 1 story or 2 story? one    Caffeine 1 cup daily     OBJECTIVE: PHYSICAL EXAM: BP 123/86   Pulse 71   Ht '5\' 8"'$  (1.727 m)   Wt 278 lb (126.1 kg)   SpO2 99%   BMI 42.27 kg/m   General: General appearance:  Awake and alert. No distress. Cooperative with exam.  Skin: No obvious rash or jaundice. HEENT: Atraumatic. Anicteric. Lungs: Non-labored breathing on room air  Psych: Flat affect  Neurological: Mental Status: Alert. Speech fluent. No pseudobulbar affect Cranial Nerves: CNII: No RAPD. Visual fields grossly intact. CNIII, IV, VI: PERRL. No nystagmus. EOMI. No APD CN V: Facial sensation intact bilaterally to fine touch. CN VII: Facial muscles symmetric and strong. No ptosis at rest. CN VIII: Hearing grossly intact bilaterally. CN IX: No hypophonia. CN X: Palate elevates symmetrically. CN XI: Full strength shoulder shrug bilaterally. CN XII: Tongue protrusion full and midline. No atrophy or fasciculations. No significant dysarthria Motor: Tone is normal. 5/5 in bilateral upper and lower extremities Reflexes: 2+ throughout. Cross adductors bilaterally at patella Pathological Reflexes: Babinski: flexor response bilaterally Hoffman: absent bilaterally Troemner: absent bilaterally Sensation: Pinprick: Intact in all extremities Vibration: Intact in all extremities Proprioception: Intact in bilateral great toes Coordination: Intact finger-to- nose-finger bilaterally. Romberg negative. Gait: Able to rise from chair with arms crossed unassisted. Normal, narrow-based gait. Able to tandem walk. Able to walk on toes and heels.  Lab and Test Review: Internal labs: 2023 labs: Normal or unremarkable: CMP, CBC, HbA1c (4.9), vit D, TSH, free T4, T3, thyroid peroxidase, B12 (713), folate, ferritin (181), cortisol Thyroglobulin  ab elevated at 35.5  MRI lumbar spine wo contrast (04/28/2018): FINDINGS: Segmentation:  Standard.   Alignment:  Physiologic.   Vertebrae: Vertebral body heights maintained without evidence for acute or chronic fracture. Bone marrow signal intensity diffusely decreased on T1 weighted imaging, most commonly seen with anemia, smoking, or obesity. No discrete or worrisome osseous lesions. No abnormal marrow edema.   Conus medullaris and cauda equina: Conus extends to the L1 level. Conus and cauda equina appear normal.   Paraspinal and other soft tissues: Paraspinous soft tissues within normal limits. Visualized visceral structures unremarkable.   Disc levels:   T11-12: Left paracentral disc protrusion indents the left ventral thecal sac. Slight superior migration. Mild spinal stenosis without significant cord deformity. Foramina remain patent.   T12-L1: Unremarkable.   L1-2: Mild annular disc bulge with disc desiccation and intervertebral disc space narrowing. No stenosis or impingement.   L2-3: Mild annular disc bulge with disc desiccation and intervertebral disc space narrowing. No stenosis or impingement.   L3-4: Mild annular disc bulge with disc desiccation and intervertebral disc space narrowing. No stenosis or impingement.   L4-5: Normal interspace. Mild bilateral facet hypertrophy. No significant stenosis or impingement.   L5-S1: Normal interspace. Mild facet hypertrophy. No stenosis or impingement.   IMPRESSION: 1. Left paracentral disc protrusion at T12-L1 with resultant mild spinal stenosis without cord deformity. 2. Mild degenerative disc bulging at L1-2 through L3-4 without significant stenosis or neural impingement. 3. Mild facet hypertrophy at L4-5 and L5-S1.   ASSESSMENT: Caymen Dubray is a 36 y.o. male who presents for evaluation of left sided numbness and tingling. He has a relevant medical history of HTN, GERD, OSA, anxiety. His neurological  examination is essentially normal. Available diagnostic data is significant for HbA1c of 4.9, B12 of 713. The etiology of patient's symptoms is currently unclear. His normal neurologic examination is reassuring. I will get an MRI of brain and cervical spine to evaluate for demyelinating disease as patient does not know if/when he last had these. My suspicion for a neurologic cause of symptoms is low though. We discussed that his symptoms could be related to anxiety.  PLAN: -MRI brain and cervical spine w/wo contrast -Could consider  Cymbalta for symptoms. Patient is not interested at this time though.  -Return to clinic to be determined after MRIs.  The impression above as well as the plan as outlined below were extensively discussed with the patient who voiced understanding. All questions were answered to their satisfaction.  When available, results of the above investigations and possible further recommendations will be communicated to the patient via telephone/MyChart. Patient to call office if not contacted after expected testing turnaround time.   Total time spent reviewing records, interview, history/exam, documentation, and coordination of care on day of encounter:  40 min   Thank you for allowing me to participate in patient's care.  If I can answer any additional questions, I would be pleased to do so.  Kai Levins, MD   CC: Ronnell Freshwater, NP [place] [phone]  CC: Referring provider: Ronnell Freshwater, NP [place],  [place] [id number]

## 2022-02-14 ENCOUNTER — Ambulatory Visit: Payer: 59 | Admitting: Neurology

## 2022-02-14 ENCOUNTER — Encounter: Payer: Self-pay | Admitting: Neurology

## 2022-02-14 VITALS — BP 123/86 | HR 71 | Ht 68.0 in | Wt 278.0 lb

## 2022-02-14 DIAGNOSIS — R200 Anesthesia of skin: Secondary | ICD-10-CM | POA: Diagnosis not present

## 2022-02-14 DIAGNOSIS — R202 Paresthesia of skin: Secondary | ICD-10-CM

## 2022-02-14 DIAGNOSIS — R209 Unspecified disturbances of skin sensation: Secondary | ICD-10-CM | POA: Diagnosis not present

## 2022-02-14 DIAGNOSIS — F411 Generalized anxiety disorder: Secondary | ICD-10-CM

## 2022-02-14 NOTE — Patient Instructions (Signed)
I saw you today for left sided numbness and tingling. Your examination today is normal, but I want to do further evaluation to see if there is a neurologic cause of your symptoms.  I would like to get an MRI of your brain and cervical spine.  When I have the results, I will be in touch with the next steps.  You may consider treatment with Cymbalta as this can help with anxiety/depression symptoms in addition to tingling/burning pain.  The physicians and staff at [facility] are committed to providing excellent care. You may receive a survey requesting feedback about your experience at our office. We strive to receive "very good" responses to the survey questions. If you feel that your experience would prevent you from giving the office a "very good " response, please contact our office to try to remedy the situation. We may be reached at [phone]. Thank you for taking the time out of your busy day to complete the survey.  Kai Levins, MD [facility]

## 2022-02-22 ENCOUNTER — Other Ambulatory Visit: Payer: Self-pay | Admitting: Physician Assistant

## 2022-02-22 DIAGNOSIS — M25562 Pain in left knee: Secondary | ICD-10-CM

## 2022-03-07 ENCOUNTER — Encounter: Payer: Self-pay | Admitting: Neurology

## 2022-03-08 ENCOUNTER — Other Ambulatory Visit: Payer: 59

## 2022-03-08 ENCOUNTER — Encounter: Payer: Self-pay | Admitting: Physician Assistant

## 2022-03-09 ENCOUNTER — Encounter: Payer: Self-pay | Admitting: Physician Assistant

## 2022-03-10 ENCOUNTER — Other Ambulatory Visit: Payer: 59

## 2022-05-10 ENCOUNTER — Telehealth: Payer: Self-pay | Admitting: Physician Assistant

## 2022-05-10 MED ORDER — DICYCLOMINE HCL 10 MG PO CAPS: 10.0000 mg | ORAL_CAPSULE | Freq: Four times a day (QID) | ORAL | 0 refills | Status: DC | PRN

## 2022-05-10 NOTE — Telephone Encounter (Signed)
PT is calling to see if he can be prescribed something for his IBS. He explained it is unbearable. He has an appointment scheduled for 4/11 but he is seeking relief now. Please advise.

## 2022-05-10 NOTE — Telephone Encounter (Signed)
Called and spoke with patient regarding recommendations. Pt has been advised to start taking Miralax 1 capful daily. Pt understands that if any worsening or ongoing pain he will need ER evaluation. Pt verbalized understanding and had no concerns at the end of the end of the call.

## 2022-05-10 NOTE — Telephone Encounter (Signed)
Returned call to patient. Pt is requesting a prescription for his IBS symptoms. I informed pt that he has not been seen in almost 2 years and I was not sure if you would prescribe anything. I recommended patient try IB Gard OTC. Pt states that he has tried everything including IB Donald Prose, FD Gard, Pepto Bismol, Imodium, and he takes Digestive Enzymes daily. Pt states that his symptoms are more IBS-C related. Pt's last BM was Sunday, pt reports that he only has about 2-3 BMs a week. Pt states that he has constant gas, sometimes the gas builds up into his chest. Pt tried Gas-X with no relief. Pt states that he intakes a lot of fiber and his PCP prescribed him a stool softener that has a laxative in it (pt did not remember the name). Pt states that he is trying to figure out if he needs to go to ER for prescriptions. I informed patient that if he is having significant abdominal pain he can go to Urgent Care, he states that they don't do anything and will tell him to follow up with GI. I informed patient that the hospital would give him a short prescription, but essentially he would still need to follow up with [place]. Please advise, thanks.

## 2022-05-18 ENCOUNTER — Ambulatory Visit
Admission: RE | Admit: 2022-05-18 | Discharge: 2022-05-18 | Disposition: A | Discharge status: Home / Self Care | Payer: 59 | Source: Ambulatory Visit | Attending: Neurology | Admitting: Neurology

## 2022-05-18 DIAGNOSIS — R209 Unspecified disturbances of skin sensation: Secondary | ICD-10-CM

## 2022-05-18 DIAGNOSIS — F411 Generalized anxiety disorder: Secondary | ICD-10-CM

## 2022-05-18 DIAGNOSIS — R200 Anesthesia of skin: Secondary | ICD-10-CM

## 2022-05-18 MED ORDER — GADOPICLENOL 0.5 MMOL/ML IV SOLN
10.0000 mL | Freq: Once | INTRAVENOUS | Status: AC | PRN
  Administered 2022-05-18: 10 mL via INTRAVENOUS

## 2022-05-20 ENCOUNTER — Encounter: Payer: Self-pay | Admitting: Neurology

## 2022-05-29 ENCOUNTER — Ambulatory Visit: Payer: 59 | Attending: Internal Medicine | Admitting: Internal Medicine

## 2022-05-29 ENCOUNTER — Encounter: Payer: Self-pay | Admitting: Internal Medicine

## 2022-05-29 VITALS — BP 112/70 | HR 54 | Ht 68.0 in | Wt 258.0 lb

## 2022-05-29 DIAGNOSIS — I10 Essential (primary) hypertension: Secondary | ICD-10-CM

## 2022-05-29 DIAGNOSIS — K219 Gastro-esophageal reflux disease without esophagitis: Secondary | ICD-10-CM

## 2022-05-29 DIAGNOSIS — R079 Chest pain, unspecified: Secondary | ICD-10-CM

## 2022-05-29 DIAGNOSIS — G4733 Obstructive sleep apnea (adult) (pediatric): Secondary | ICD-10-CM

## 2022-05-29 NOTE — Progress Notes (Signed)
Cardiology Office Note:    Date:  05/29/2022   ID:  Eddie Murray, DOB 1985-08-16, MRN [id number]  PCP:  Ronnell Freshwater, NP   [place] Providers Cardiologist:  Werner Lean, MD     Referring MD: Ronnell Freshwater, NP   CC:  Chest pain  History of Present Illness:    Eddie Murray is a 37 y.o. male with a hx of HTN, Morbid Obesity, OSA, and former Tobacco Abuse.   2021 had palpitations and rare PVCs  started on propranolol. 2022: Had DOE with normal echo  Patient notes that he is doing ok.   Since last visit notes no palpations or shortness of breath.  He works out mostly every day and feels well There are no interval hospital/ED visit.    He notes that he has had gas and chest pain that improves with belching.  He is not smoking.  He has now figured out what triggers this.  He has some HLD no early CAD in the family.   Past Medical History:  Diagnosis Date   ADD (attention deficit disorder)    Allergies    Anemia    Anxiety disorder    Arthritis    Asthma    Bladder problem    Bleeding disorder ([place])    CAD (coronary artery disease)    Cancer (HCC)    Congenital abnormalities    Constipation    COPD (chronic obstructive pulmonary disease) ([place])    Depression    Diabetes ([place])    Dialysis patient ([place])    Diverticulitis    DVT (deep venous thrombosis) (HCC)    Ear problems    Eczema    ED (erectile dysfunction)    Fibromyalgia    GERD (gastroesophageal reflux disease)    Gout    Heart attack ([place])    Heart disease    High cholesterol    Hypertension    Hyperthyroidism    Hypogonadism in male    Hypothyroidism    Joint pain    Kidney disease    Kidney stone    Leg ulcer ([place])    Liver disease    Obesity    OSA (obstructive sleep apnea)    Osteoporosis    Pacemaker    Pulmonary embolism ([place])    REM behavioral disorder    Seizures ([place])    Skin disorder    Stroke [facility])    Thyroid disorder    Tuberculosis     Past  Surgical History:  Procedure Laterality Date   BIOPSY  03/09/2021   Procedure: BIOPSY;  Surgeon: Lavena Bullion, DO;  Location: [facility];  Service: Gastroenterology;;  EGD and COLON   COLONOSCOPY WITH PROPOFOL N/A 03/09/2021   Procedure: COLONOSCOPY WITH PROPOFOL;  Surgeon: Lavena Bullion, DO;  Location: [facility];  Service: Gastroenterology;  Laterality: N/A;   ESOPHAGOGASTRODUODENOSCOPY (EGD) WITH PROPOFOL N/A 03/09/2021   Procedure: ESOPHAGOGASTRODUODENOSCOPY (EGD) WITH PROPOFOL;  Surgeon: Lavena Bullion, DO;  Location: [facility];  Service: Gastroenterology;  Laterality: N/A;   INSERT / REPLACE / REMOVE PACEMAKER      Current Medications: Current Meds  Medication Sig   Carboxymethylcellul-Glycerin (LUBRICATING EYE DROPS OP) Place 1 drop into both eyes daily as needed (dry eyes).   clonazePAM (KLONOPIN) 0.5 MG tablet Take 0.5 mg by mouth 2 (two) times daily as needed for anxiety.   Cyanocobalamin (B-12 PO) Take 1 Dose by mouth daily as needed (energy). Liquid   dicyclomine (BENTYL)  10 MG capsule Take 1 capsule (10 mg total) by mouth every 6 (six) hours as needed (abdominal cramping).   Digestive Enzyme CAPS Take 2 capsules by mouth daily.   Ferrous Sulfate (IRON PO) Take 1 tablet by mouth daily as needed (feeling tired).   MAGNESIUM PO Take 1 tablet by mouth at bedtime.   omeprazole (PRILOSEC) 40 MG capsule Take 40 mg by mouth daily as needed for heartburn.   propranolol ER (INDERAL LA) 60 MG 24 hr capsule Take 60 mg by mouth daily.   Vitamin D-Vitamin K (VITAMIN K2-VITAMIN D3 PO) Take 1 capsule by mouth daily.   [DISCONTINUED] Semaglutide,0.25 or 0.5MG /DOS, (OZEMPIC, 0.25 OR 0.5 MG/DOSE,) 2 MG/1.5ML SOPN Inject 0.5 mg into the skin once a week.     Allergies:   Patient has no known allergies.   Social History   Socioeconomic History   Marital status: Single    Spouse name: Not on file   Number of children: Not on file   Years of education: Not on file    Highest education level: Not on file  Occupational History   Not on file  Tobacco Use   Smoking status: Former    Types: Cigarettes, Cigars    Quit date: 2017    Years since quitting: 7.2   Smokeless tobacco: Never  Vaping Use   Vaping Use: Never used  Substance and Sexual Activity   Alcohol use: Never   Drug use: Never   Sexual activity: Not Currently  Other Topics Concern   Not on file  Social History Narrative   Are you right handed or left handed? right   Are you currently employed ? 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    What type of home do you live in: 1 story or 2 story? one    Caffeine 1 cup daily   Social Determinants of Health   Financial Resource Strain: Not on file  Food Insecurity: Not on file  Transportation Needs: Not on file  Physical Activity: Not on file  Stress: Not on file  Social Connections: Not on file     Family History: The patient's family history includes Diabetes in his father; Hypertension in his father and mother; Other in his maternal grandfather; Thyroid disease in his maternal grandfather and mother.  ROS:   Please see the history of present illness.     All other systems reviewed and are negative.  EKGs/Labs/Other Studies Reviewed:    The following studies were reviewed today:   EKG:  EKG is  ordered today.  The ekg ordered today demonstrates  05/29/2022: Sinus bradycardia   Cardiac Studies & Procedures       ECHOCARDIOGRAM  ECHOCARDIOGRAM COMPLETE 04/26/2020  Narrative ECHOCARDIOGRAM REPORT    Patient Name:   Eddie Murray Date of Exam: 04/26/2020 Medical Rec #:  [id number]     Height:       68.0 in Accession #:    [id number]    Weight:       416.4 lb Date of Birth:  [birth year]     BSA:          2.791 m Patient Age:    34 years      BP:           118/80 mmHg Patient Gender: M             HR:  77 bpm. Exam Location:  [address]  Procedure: 2D Echo, Cardiac Doppler, Color Doppler and Intracardiac Opacification Agent  Indications:    R06.00 Dyspnea  History:        Patient has no prior history of Echocardiogram examinations. Risk Factors:Hypertension, Diabetes, Dyslipidemia and Morbid Obesity. Obstructive sleep apnea-CPAP. Stroke. Seizures. Thyroid disorder. Coronary artery disease. Pulmonary embolism. COPD.  Sonographer:    Cresenciano Lick RDCS Referring Phys: [id number] [facility]uegra  IMPRESSIONS   1. Left ventricular ejection fraction, by estimation, is 60 to 65%. The left ventricle has normal function. The left ventricle has no regional wall motion abnormalities. Left ventricular diastolic parameters were normal. 2. Right ventricular systolic function is normal. The right ventricular size is normal. 3. The mitral valve is normal in structure. No evidence of mitral valve regurgitation. No evidence of mitral stenosis. 4. The aortic valve is grossly normal. Aortic valve regurgitation is not visualized. No aortic stenosis is present.  FINDINGS Left Ventricle: Left ventricular ejection fraction, by estimation, is 60 to 65%. The left ventricle has normal function. The left ventricle has no regional wall motion abnormalities. Definity contrast agent was given IV to delineate the left ventricular endocardial borders. The left ventricular internal cavity size was normal in size. There is no left ventricular hypertrophy. Left ventricular diastolic parameters were normal.  Right Ventricle: The right ventricular size is normal. No increase in right ventricular wall thickness. Right ventricular systolic function is normal.  Left Atrium: Left atrial size was normal in size.  Right Atrium: Right atrial size was normal in size.  Pericardium: There is no evidence of pericardial effusion.  Mitral Valve: The mitral valve is normal in structure. No evidence of mitral valve regurgitation. No evidence of  mitral valve stenosis.  Tricuspid Valve: The tricuspid valve is grossly normal. Tricuspid valve regurgitation is not demonstrated.  Aortic Valve: The aortic valve is grossly normal. Aortic valve regurgitation is not visualized. No aortic stenosis is present.  Pulmonic Valve: The pulmonic valve was normal in structure. Pulmonic valve regurgitation is not visualized.  Aorta: The aortic root and ascending aorta are structurally normal, with no evidence of dilitation.  IAS/Shunts: The atrial septum is grossly normal.   LEFT VENTRICLE PLAX 2D LVIDd:         4.80 cm  Diastology LVIDs:         3.00 cm  LV e' medial:    10.90 cm/s LV PW:         1.10 cm  LV E/e' medial:  8.0 LV IVS:        1.10 cm  LV e' lateral:   12.50 cm/s LVOT diam:     2.10 cm  LV E/e' lateral: 7.0 LV SV:         72 LV SV Index:   26 LVOT Area:     3.46 cm   RIGHT VENTRICLE             IVC RV Basal diam:  4.50 cm     IVC diam: 1.80 cm RV S prime:     16.20 cm/s TAPSE (M-mode): 2.4 cm  LEFT ATRIUM             Index       RIGHT ATRIUM           Index LA diam:        5.10 cm 1.83 cm/m  RA Area:     13.70 cm LA Vol (A2C):   72.2 ml 25.87  ml/m RA Volume:   34.80 ml  12.47 ml/m LA Vol (A4C):   49.8 ml 17.84 ml/m LA Biplane Vol: 62.5 ml 22.39 ml/m AORTIC VALVE LVOT Vmax:   90.70 cm/s LVOT Vmean:  69.100 cm/s LVOT VTI:    0.208 m  AORTA Ao Root diam: 3.30 cm Ao Asc diam:  3.20 cm  MITRAL VALVE MV Area (PHT): 3.99 cm    SHUNTS MV Decel Time: 190 msec    Systemic VTI:  0.21 m MV E velocity: 87.40 cm/s  Systemic Diam: 2.10 cm MV A velocity: 70.70 cm/s MV E/A ratio:  1.24  Mertie Moores MD Electronically signed by Mertie Moores MD Signature Date/Time: 04/26/2020/2:10:56 PM    Final    MONITORS  LONG TERM MONITOR (3-14 DAYS) 01/31/2020  Narrative  Patient had a min HR of 57 bpm, max HR of 149 bpm, and avg HR of 79 bpm.  Predominant underlying rhythm was sinus rhythm.  Isolated PACs were  rare (<1.0%).  Isolated PVCs were rare (<1.0%), with rare bigeminy.  QRS complex inversion likely due to device placement.  No evidence of complete heart block.  Overwhelmingly, triggered and diary events associated with sinus rhythm.  Rarely, triggered events associated with PVCs or PACs.  Rare, possibly symptomatic PVCs.            Recent Labs: No results found for requested labs within last 365 days.  Recent Lipid Panel    Component Value Date/Time   CHOL 169 05/25/2021 1002   TRIG 75 05/25/2021 1002   HDL 40 05/25/2021 1002   CHOLHDL 4.2 05/25/2021 1002   LDLCALC 115 (H) 05/25/2021 1002    Physical Exam:    VS:  BP 112/70   Pulse (!) 54   Ht 5\' 8"  (1.727 m)   Wt 258 lb (117 kg)   SpO2 97%   BMI 39.23 kg/m     Wt Readings from Last 3 Encounters:  05/29/22 258 lb (117 kg)  02/14/22 278 lb (126.1 kg)  05/25/21 (!) 352 lb 4.8 oz (159.8 kg)     Gen: no distress,   Neck: No JVD  Cardiac: No Rubs or Gallops, no murmur, RRR +2 radial pulses Respiratory: Clear to auscultation bilaterally, normal effort, normal  respiratory rate GI: Soft, nontender, non-distended  MS: No  edema;  moves all extremities Integument: Skin feels warm Neuro:  At time of evaluation, alert and oriented to person/place/time/situation  Psych: Normal affect, patient feels ok   ASSESSMENT:    1. Chest pain of uncertain etiology   2. Gastroesophageal reflux disease without esophagitis   3. OSA (obstructive sleep apnea)   4. Morbid obesity ([place])   5. Essential hypertension    PLAN:    Atypical chest pain GERD Former smoker - will get stress test (POET) - if equivocal will do CCTA  Morbid obesity OSA  - if negative stress test continue weight loss and working out interventions  Rare PVCs HTN - continuing propranolol is reasonable  PRN unless positive testing      Shared Decision Making/Informed Consent The risks [chest pain, shortness of breath, cardiac arrhythmias,  dizziness, blood pressure fluctuations, myocardial infarction, stroke/transient ischemic attack, and life-threatening complications (estimated to be 1 in 10,000)], benefits (risk stratification, diagnosing coronary artery disease, treatment guidance) and alternatives of an exercise tolerance test were discussed in detail with Mr. Augustus and he agrees to proceed.    Medication Adjustments/Labs and Tests Ordered: Current medicines are reviewed at length with the patient today.  Concerns regarding medicines are outlined above.  Orders Placed This Encounter  Procedures   Cardiac Stress Test: Informed Consent Details: Physician/Practitioner Attestation; Transcribe to consent form and obtain patient signature   EXERCISE TOLERANCE TEST (ETT)   EKG 12-Lead   No orders of the defined types were placed in this encounter.   Patient Instructions  Medication Instructions:  Your physician recommends that you continue on your current medications as directed. Please refer to the Current Medication list given to you today.  *If you need a refill on your cardiac medications before your next appointment, please call your pharmacy*   Lab Work: NONE If you have labs (blood work) drawn today and your tests are completely normal, you will receive your results only by: [place] (if you have MyChart) OR A paper copy in the mail If you have any lab test that is abnormal or we need to change your treatment, we will call you to review the results.   Testing/Procedures: Your physician has requested that you have an exercise tolerance test. For further information please visit [email or web address]. Please also follow instruction sheet, as given.    Follow-Up:As Needed At [facility], you and your health needs are our priority.  As part of our continuing mission to provide you with exceptional heart care, we have created designated Provider Care Teams.  These Care Teams include your primary  Cardiologist (physician) and Advanced Practice Providers (APPs -  Physician Assistants and Nurse Practitioners) who all work together to provide you with the care you need, when you need it.   Provider:   Werner Lean, MD       Signed, Werner Lean, MD  05/29/2022 5:24 PM    [place] +

## 2022-05-29 NOTE — Patient Instructions (Signed)
Medication Instructions:  Your physician recommends that you continue on your current medications as directed. Please refer to the Current Medication list given to you today.  *If you need a refill on your cardiac medications before your next appointment, please call your pharmacy*   Lab Work: NONE If you have labs (blood work) drawn today and your tests are completely normal, you will receive your results only by: [place] (if you have MyChart) OR A paper copy in the mail If you have any lab test that is abnormal or we need to change your treatment, we will call you to review the results.   Testing/Procedures: Your physician has requested that you have an exercise tolerance test. For further information please visit [email or web address]. Please also follow instruction sheet, as given.    Follow-Up:As Needed At [facility], you and your health needs are our priority.  As part of our continuing mission to provide you with exceptional heart care, we have created designated Provider Care Teams.  These Care Teams include your primary Cardiologist (physician) and Advanced Practice Providers (APPs -  Physician Assistants and Nurse Practitioners) who all work together to provide you with the care you need, when you need it.   Provider:   Werner Lean, MD

## 2022-06-05 ENCOUNTER — Ambulatory Visit: Payer: 59 | Attending: Internal Medicine

## 2022-06-05 DIAGNOSIS — R079 Chest pain, unspecified: Secondary | ICD-10-CM

## 2022-06-05 LAB — EXERCISE TOLERANCE TEST
Angina Index: 0
Duke Treadmill Score: 11
Estimated workload: 13.3
Exercise duration (min): 11 min
Exercise duration (sec): 0 s
MPHR: 186 {beats}/min
Peak HR: 186 {beats}/min
Percent HR: 86 %
RPE: 15
Rest HR: 61 {beats}/min
ST Depression (mm): 0 mm

## 2022-06-21 ENCOUNTER — Ambulatory Visit: Payer: 59 | Admitting: Internal Medicine

## 2022-06-22 ENCOUNTER — Encounter: Payer: Self-pay | Admitting: Gastroenterology

## 2022-06-22 ENCOUNTER — Ambulatory Visit: Payer: 59 | Admitting: Gastroenterology

## 2022-06-22 VITALS — BP 122/80 | HR 66 | Ht 68.0 in | Wt 253.0 lb

## 2022-06-22 DIAGNOSIS — R109 Unspecified abdominal pain: Secondary | ICD-10-CM | POA: Diagnosis not present

## 2022-06-22 DIAGNOSIS — K641 Second degree hemorrhoids: Secondary | ICD-10-CM

## 2022-06-22 DIAGNOSIS — K581 Irritable bowel syndrome with constipation: Secondary | ICD-10-CM

## 2022-06-22 MED ORDER — LUBIPROSTONE 24 MCG PO CAPS: 24.0000 ug | ORAL_CAPSULE | Freq: Two times a day (BID) | ORAL | 1 refills | Status: DC

## 2022-06-22 MED ORDER — LUBIPROSTONE 24 MCG PO CAPS: 24.0000 ug | ORAL_CAPSULE | Freq: Four times a day (QID) | ORAL | 5 refills | Status: DC | PRN

## 2022-06-22 MED ORDER — DICYCLOMINE HCL 10 MG PO CAPS: 10.0000 mg | ORAL_CAPSULE | Freq: Four times a day (QID) | ORAL | 5 refills | Status: DC | PRN

## 2022-06-22 NOTE — Progress Notes (Signed)
Chief Complaint:    Generalized abdominal pain, constipation, change in bowel habits  GI History: 37 year old male with a history of anxiety, HTN, OSA, depression, obesity, GERD, follows in the GI clinic for the following:  - 02/2018: EGD ([facility]): mild reflux esophagitis, biopsies negative for Barrett's esophagus and eosinophilic esophagitis. Biopsies from the small bowel negative for celiac disease. - 04/11/2020: Evaluated by [facility] for abdominal pain, cramping, gas, bloating, and spasms. At that time discussed similar symptoms in 2019.  Recommended sertraline for treatment of underlying anxiety, but patient stopped due to weight gain. - 11/16/2020: Initial appointment in [place] [facility] clinic for evaluation of generalized abdominal cramping, gas, borborygmi, changes in bowel habits (constipation with BM every 3-4 days).  Reportedly trialed high-fiber diet but that resulted in BRBPR.  Took MiraLAX but stopped after 1 dose due to watery stools.  Strong association with underlying anxiety and GI symptoms, with history of panic attacks.  Will schedule for EGD/colonoscopy at [facility] due to morbid obesity - 03/09/2021: EGD: Normal esophagus, stomach (path: Chronic non-H. pylori gastritis).  10 mm nodule in D2 (path: Peptic duodenitis).  Remainder of the duodenum was normal.  Recommended Prilosec 40 mg p.o. twice daily x 6 weeks then titrate to lowest effective dose - 03/09/2021: Colonoscopy: Normal, small internal hemorrhoids.  Mild inflammatory changes at the IC valve, but remainder of the ileum was normal (path: Patchy, mild active ileitis without features of chronicity).  Repeat colonoscopy at age 70 for ongoing screening  HPI:     Patient is a 37 y.o. male presenting to the Gastroenterology Clinic for follow-up.   Main issue today is constipation/IBS-C. Still with 2 BM/week, with dulcolax. Can also improve with beet juice.   Cramps improve with Bentyl. Requesting refill today.    Uses peppermint oil.  Has previously trialed FD guard and IBgard.  Occasional hemorrhoidal sxs.   Reviewed most recent labs from 05/2021, notable for folate 3.8, and otherwise normal B12, ferritin, vitamin D, CBC, CMP, hemoglobin A1c, TSH, T4.  No labs since then.  Recent MRI brain was normal.  No recent abdominal imaging for review.  Review of systems:     No chest pain, no SOB, no fevers, no urinary sx   Past Medical History:  Diagnosis Date   ADD (attention deficit disorder)    Allergies    Anemia    Anxiety disorder    Arthritis    Asthma    Bladder problem    Bleeding disorder    CAD (coronary artery disease)    Cancer    Congenital abnormalities    Constipation    COPD (chronic obstructive pulmonary disease)    Depression    Diabetes    Dialysis patient    Diverticulitis    DVT (deep venous thrombosis)    Ear problems    Eczema    ED (erectile dysfunction)    Fibromyalgia    GERD (gastroesophageal reflux disease)    Gout    Heart attack    Heart disease    High cholesterol    Hypertension    Hyperthyroidism    Hypogonadism in male    Hypothyroidism    Joint pain    Kidney disease    Kidney stone    Leg ulcer    Liver disease    Obesity    OSA (obstructive sleep apnea)    Osteoporosis    Pacemaker    Pulmonary embolism    REM behavioral disorder  Seizures    Skin disorder    Stroke    Thyroid disorder    Tuberculosis     Patient's surgical history, family medical history, social history, medications and allergies were all reviewed in Epic    Current Outpatient Medications  Medication Sig Dispense Refill   Carboxymethylcellul-Glycerin (LUBRICATING EYE DROPS OP) Place 1 drop into both eyes daily as needed (dry eyes).     clonazePAM (KLONOPIN) 0.5 MG tablet Take 0.5 mg by mouth 2 (two) times daily as needed for anxiety.     Cyanocobalamin (B-12 PO) Take 1 Dose by mouth daily as needed (energy). Liquid     dicyclomine (BENTYL) 10 MG capsule  Take 1 capsule (10 mg total) by mouth every 6 (six) hours as needed (abdominal cramping). 30 capsule 0   Digestive Enzyme CAPS Take 2 capsules by mouth daily.     Ferrous Sulfate (IRON PO) Take 1 tablet by mouth daily as needed (feeling tired).     MAGNESIUM PO Take 1 tablet by mouth at bedtime.     omeprazole (PRILOSEC) 40 MG capsule Take 40 mg by mouth daily as needed for heartburn.     propranolol ER (INDERAL LA) 60 MG 24 hr capsule Take 60 mg by mouth daily.     Vitamin D-Vitamin K (VITAMIN K2-VITAMIN D3 PO) Take 1 capsule by mouth daily.     No current facility-administered medications for this visit.    Physical Exam:     BP 122/80   Pulse 66   Ht  (1.727 m)   Wt 253 lb (114.8 kg)   BMI 38.47 kg/m   GENERAL:  Pleasant male in NAD PSYCH: : Cooperative, normal affect NEURO: Alert and oriented x 3, no focal neurologic deficits   IMPRESSION and PLAN:    1) IBS-C 2) Abdominal cramping - Start Amitiza 24 mcg bid - Discussed potential for diarrhea, abdominal cramping, etc. within the first 7-14 days of initiating Amitiza - Refilled Bentyl 10 mg prn every 6 hours as needed for abdominal cramping  3) Symptomatic hemorrhoids 4) Internal hemorrhoids - If hemorrhoidal symptoms persist despite improvement in underlying constipation, discussed potential for hemorrhoid band ligation in the office   RTC in 6 months or sooner prn           Verlin Dike [name] ,DO, FACG 06/22/2022, 3:03 PM

## 2022-06-22 NOTE — Patient Instructions (Addendum)
We have sent the following medications to your pharmacy for you to pick up at your convenience: Bentyl, Amitiza  Please follow up in 6 months. Give [place] a call at [phone] to schedule an appointment.   _______________________________________________________  If your blood pressure at your visit was 140/90 or greater, please contact your primary care physician to follow up on this.  _______________________________________________________  If you are age 35 or younger, your body mass index should be between 19-25. Your Body mass index is 38.47 kg/m. If this is out of the aformentioned range listed, please consider follow up with your Primary Care Provider.   __________________________________________________________  The [facility] providers would like to encourage you to use [facility] to communicate with providers for non-urgent requests or questions.  Due to long hold times on the telephone, sending your provider a message by [facility] may be a faster and more efficient way to get a response.  Please allow 48 business hours for a response.  Please remember that this is for non-urgent requests.    Due to recent changes in healthcare laws, you may see the results of your imaging and laboratory studies on MyChart before your provider has had a chance to review them.  We understand that in some cases there may be results that are confusing or concerning to you. Not all laboratory results come back in the same time frame and the provider may be waiting for multiple results in order to interpret others.  Please give [place] 48 hours in order for your provider to thoroughly review all the results before contacting the office for clarification of your results.   Thank you for choosing me and [facility].  Vito Cirigliano, D.O.

## 2022-06-27 ENCOUNTER — Other Ambulatory Visit (HOSPITAL_COMMUNITY): Payer: Self-pay

## 2022-06-27 ENCOUNTER — Telehealth: Payer: Self-pay

## 2022-06-27 NOTE — Telephone Encounter (Signed)
Patient Advocate Encounter   Received notification from [employer] that prior authorization is required for Amitiza capsules   Submitted: 06-27-2022 Key [id number]  Status is pending

## 2022-07-02 NOTE — Telephone Encounter (Signed)
Plan prefers generic- Key: [id number] - PA Case ID: [id number]

## 2022-07-02 NOTE — Telephone Encounter (Signed)
PA approved.

## 2022-08-20 ENCOUNTER — Telehealth: Payer: Self-pay | Admitting: Gastroenterology

## 2022-08-20 NOTE — Telephone Encounter (Signed)
Returned call to patient. I explained to patient that Dr. Frankey Shown next available appt is not for several months. I offered pt an appt with APP later this week but he will be out of town. Pt has been scheduled for a follow up wit Quentin Mulling, PA-C on Monday, 08/27/22 at 10:30 am. Pt verbalized understanding and had no concerns at the end of the call.

## 2022-08-20 NOTE — Telephone Encounter (Signed)
Received MyChart message from patient stating he was having IBS symptom flare-up.  Wants to see Dr. Barron Alvine.  His first appt for me is in September.  Please call patient and advise.  Thank you.

## 2022-08-21 ENCOUNTER — Emergency Department (HOSPITAL_COMMUNITY)
Admission: EM | Admit: 2022-08-21 | Discharge: 2022-08-21 | Disposition: A | Discharge status: Home / Self Care | Payer: 59 | Attending: Emergency Medicine | Admitting: Emergency Medicine

## 2022-08-21 ENCOUNTER — Encounter (HOSPITAL_COMMUNITY): Payer: Self-pay

## 2022-08-21 ENCOUNTER — Emergency Department (HOSPITAL_COMMUNITY): Payer: 59

## 2022-08-21 DIAGNOSIS — Z79899 Other long term (current) drug therapy: Secondary | ICD-10-CM | POA: Insufficient documentation

## 2022-08-21 DIAGNOSIS — R1012 Left upper quadrant pain: Secondary | ICD-10-CM | POA: Insufficient documentation

## 2022-08-21 DIAGNOSIS — E039 Hypothyroidism, unspecified: Secondary | ICD-10-CM | POA: Diagnosis not present

## 2022-08-21 DIAGNOSIS — I10 Essential (primary) hypertension: Secondary | ICD-10-CM | POA: Insufficient documentation

## 2022-08-21 LAB — CBC
HCT: 44.2 % (ref 39.0–52.0)
Hemoglobin: 14.7 g/dL (ref 13.0–17.0)
MCH: 30.4 pg (ref 26.0–34.0)
MCHC: 33.3 g/dL (ref 30.0–36.0)
MCV: 91.5 fL (ref 80.0–100.0)
Platelets: 164 10*3/uL (ref 150–400)
RBC: 4.83 MIL/uL (ref 4.22–5.81)
RDW: 12.3 % (ref 11.5–15.5)
WBC: 4.8 10*3/uL (ref 4.0–10.5)
nRBC: 0 % (ref 0.0–0.2)

## 2022-08-21 LAB — COMPREHENSIVE METABOLIC PANEL
ALT: 23 U/L (ref 0–44)
AST: 28 U/L (ref 15–41)
Albumin: 4.2 g/dL (ref 3.5–5.0)
Alkaline Phosphatase: 43 U/L (ref 38–126)
Anion gap: 9 (ref 5–15)
BUN: 13 mg/dL (ref 6–20)
CO2: 26 mmol/L (ref 22–32)
Calcium: 9.2 mg/dL (ref 8.9–10.3)
Chloride: 101 mmol/L (ref 98–111)
Creatinine, Ser: 0.86 mg/dL (ref 0.61–1.24)
GFR, Estimated: >60 mL/min (ref >60)
Glucose, Bld: 88 mg/dL (ref 70–99)
Potassium: 4.2 mmol/L (ref 3.5–5.1)
Sodium: 136 mmol/L (ref 135–145)
Total Bilirubin: 1.1 mg/dL (ref 0.3–1.2)
Total Protein: 7.3 g/dL (ref 6.5–8.1)

## 2022-08-21 LAB — LIPASE, BLOOD: Lipase: 30 U/L (ref 11–51)

## 2022-08-21 MED ORDER — ACETAMINOPHEN 500 MG PO TABS
1000.0000 mg | ORAL_TABLET | Freq: Four times a day (QID) | ORAL | Status: DC | PRN
  Administered 2022-08-21: 1000 mg via ORAL
  Filled 2022-08-21: qty 2

## 2022-08-21 MED ORDER — PANTOPRAZOLE SODIUM 20 MG PO TBEC: 20.0000 mg | DELAYED_RELEASE_TABLET | Freq: Every day | ORAL | 0 refills | Status: DC

## 2022-08-21 MED ORDER — IOHEXOL 350 MG/ML SOLN
75.0000 mL | Freq: Once | INTRAVENOUS | Status: AC | PRN
  Administered 2022-08-21: 75 mL via INTRAVENOUS

## 2022-08-21 NOTE — Discharge Instructions (Addendum)
Mr Dible,  It was a pleasure taking care of you while you were in the emergency department. Your abdominal pain is likely caused by irritable bowel syndrome. We performed a CT scan to rule out any serious causes and this test was negative. You may have a gastric ulcer that could explain why your symptoms have recently worsened.  We are sending you home with pantoprazole, which may help improve your symptoms. Please follow up with your gastroenterologist next week on 6-17 for further evaluation.  If your abdominal pain worsens, then please return to the emergency department.

## 2022-08-21 NOTE — ED Triage Notes (Signed)
Pt arrives today c/o LUQ pain. Pt has been unable to sleep due to pain unless he uses melatonin. Pt is concerned about his spleen.   Pt endorses nausea and constipation

## 2022-08-21 NOTE — ED Provider Notes (Signed)
[place] EMERGENCY DEPARTMENT AT [facility] Provider Note   CSN: [id number] Arrival date & time: 08/21/22  1024     History  Obesity Hypertension Hyperlipidemia Hypothyroidism Anxiety-depression Irritable bowel syndrome constipation   Chief Complaint  Patient presents with   Abdominal Pain    Eddie Murray is a 37 y.o. male with a past medical history of obesity, hypertension, irritable bowel syndrome, and anxiety who presents with abdominal pain.  Patient developed abdominal pain about two weeks ago and its persistence prompted him to seek medical attention. Describes the pain as a sharp stabbing constant sensation located in his upper abdomen just left of midline. Sometimes associated with another discomfort that involves his left shoulder. Feels okay during the day, but then pain worsens at night while he lies in bed trying to fall asleep. The persistence of this pain has created significant anxiety and he worries about a serious underlying condition. Cannot identify any provoking factors including posture, meals, and defecation. Denies nausea, vomiting, hematochezia, melena, chest pain, recent trauma, tobacco use, and changes in bowel quality or frequency.   Pain is similar to longstanding abdominal discomfort that he has been experiencing intermittently since 2019. Extensive workup in the outpatient setting has been largely unrevealing. Upper endoscopy performed 02-2021 revealed chronic gastritis and peptic duodenitis. Colonoscopy demonstrated mild inflammatory changes at the ileocecal valve. Recently visited gastroenterology in 06-2022 when he was prescribed lubiprostone and dicyclomine. Reports daily adherence to these medications and feels as though they are helping his symptoms.   Abdominal Pain     Home Medications Prior to Admission medications   Medication Sig Start Date End Date Taking? Authorizing Provider  pantoprazole (PROTONIX) 20 MG tablet Take 1  tablet (20 mg total) by mouth daily. 08/21/22  Yes Crissie Sickles, MD  Carboxymethylcellul-Glycerin (LUBRICATING EYE DROPS OP) Place 1 drop into both eyes daily as needed (dry eyes).    [provider]  clonazePAM (KLONOPIN) 0.5 MG tablet Take 0.5 mg by mouth 2 (two) times daily as needed for anxiety. 12/01/19   [provider]  Cyanocobalamin (B-12 PO) Take 1 Dose by mouth daily as needed (energy). Liquid    [provider]  dicyclomine (BENTYL) 10 MG capsule Take 1 capsule (10 mg total) by mouth every 6 (six) hours as needed (abdominal cramping). 06/22/22   Cirigliano, Vito V, DO  Digestive Enzyme CAPS Take 2 capsules by mouth daily.    [provider]  Ferrous Sulfate (IRON PO) Take 1 tablet by mouth daily as needed (feeling tired).    [provider]  lubiprostone (AMITIZA) 24 MCG capsule Take 1 capsule (24 mcg total) by mouth in the morning and at bedtime. 06/22/22   Cirigliano, Vito V, DO  MAGNESIUM PO Take 1 tablet by mouth at bedtime.    [provider]  omeprazole (PRILOSEC) 40 MG capsule Take 40 mg by mouth daily as needed for heartburn. 12/04/20   [provider]  propranolol ER (INDERAL LA) 60 MG 24 hr capsule Take 60 mg by mouth daily.    [provider]  Vitamin D-Vitamin K (VITAMIN K2-VITAMIN D3 PO) Take 1 capsule by mouth daily.    [provider]      Allergies    Patient has no known allergies.    Review of Systems   Review of Systems  Gastrointestinal:  Positive for abdominal pain.  Occasional shoulder pain   Physical Exam Updated Vital Signs BP 133/84   Pulse 73   Temp  98.4 F (36.9 C) (Oral)   Resp 18   Ht 5\' 8"  (1.727 m)   Wt 113.4 kg   SpO2 99%   BMI 38.01 kg/m  Physical Exam  Awake and alert, fully oriented, anxious appearing, cooperative, not in acute distress Breathing unlabored, lungs clear to auscultation bilaterally, no wheezing or crackles Regular heart rate and rhythm,  normal S1 and S2, capillary refill ~1 second, no murmurs Soft and non-distended abdomen, hypoactive bowel sounds, no guarding or rigidity Mild tenderness over LUQ, epigastric region about 1-2cm left of midline, and LLQ   ED Results / Procedures / Treatments   Labs (all labs ordered are listed, but only abnormal results are displayed) Labs Reviewed  CBC  COMPREHENSIVE METABOLIC PANEL  LIPASE, BLOOD    EKG None  Radiology CT ABDOMEN PELVIS W CONTRAST  Result Date: 08/21/2022 CLINICAL DATA:  Left lower quadrant abdominal pain. EXAM: CT ABDOMEN AND PELVIS WITH CONTRAST TECHNIQUE: Multidetector CT imaging of the abdomen and pelvis was performed using the standard protocol following bolus administration of intravenous contrast. RADIATION DOSE REDUCTION: This exam was performed according to the departmental dose-optimization program which includes automated exposure control, adjustment of the mA and/or kV according to patient size and/or use of iterative reconstruction technique. CONTRAST:  75mL OMNIPAQUE IOHEXOL 350 MG/ML SOLN COMPARISON:  None Available. FINDINGS: Lower chest: No acute abnormality. Hepatobiliary: No focal liver abnormality is seen. Status post cholecystectomy. No biliary dilatation. Pancreas: Unremarkable. No pancreatic ductal dilatation or surrounding inflammatory changes. Spleen: Normal in size without focal abnormality. Adrenals/Urinary Tract: Adrenal glands are unremarkable. Kidneys are normal, without renal calculi, focal lesion, or hydronephrosis. Bladder is unremarkable. Stomach/Bowel: Stomach is within normal limits. Appendix appears normal. No evidence of bowel wall thickening, distention, or inflammatory changes. Mildly increased stool burden throughout colon. Vascular/Lymphatic: No significant vascular findings are present. No enlarged abdominal or pelvic lymph nodes. Reproductive: Prostate is unremarkable. Other: No abdominal wall hernia or abnormality. No abdominopelvic  ascites. No pneumoperitoneum. Musculoskeletal: No acute or significant osseous findings. IMPRESSION: 1. No acute intra-abdominal process. 2. Mildly increased stool burden throughout the colon. Correlate constipation. Electronically Signed   By: Obie Dredge M.D.   On: 08/21/2022 15:06    Procedures Procedures   Abdominal-pelvic CT scan   Medications Ordered in ED Medications  acetaminophen (TYLENOL) tablet 1,000 mg (1,000 mg Oral Given 08/21/22 1408)  iohexol (OMNIPAQUE) 350 MG/ML injection 75 mL (75 mLs Intravenous Contrast Given 08/21/22 1323)    ED Course/ Medical Decision Making/ A&P   {   Click here for ABCD2, HEART and other calculators                         Medical Decision Making  Patient developed sharp stabbing left-sided abdominal pain about two weeks ago of similar quality to previous symptoms. Persistence and intensity of the pain prompted him to seek medical attention. Endoscopy in 02-2021 demonstrated chronic gastritis, peptic duodenitis, and mild inflammatory changes involving ileocecal valve. Extensive workup in the outpatient setting has led to an IBS diagnosis, and patient describes experiencing significant anxiety related to his symptoms. Presentation consistent with IBS, possibly complicated by a gastric ulcer. High suspicion for concurrent somatic symptom disorder. Results of abdominal CT performed to exclude pancreatitis, diverticulosis, nephrolithiasis, pyelonephritis, cystitis, and splenic pathology were unremarkable. Discharged with pantoprazole and instructions to follow up with his gastroenterologist, appointment is scheduled for 6-17.   Final Clinical Impression(s) / ED Diagnoses Final diagnoses:  Left upper quadrant  abdominal pain    Rx / DC Orders ED Discharge Orders          Ordered    pantoprazole (PROTONIX) 20 MG tablet  Daily        08/21/22 1459              Crissie Sickles, MD 08/21/22 1522    Tegeler, Canary Brim, MD 08/22/22  [phone]

## 2022-08-24 NOTE — Progress Notes (Signed)
08/27/2022 [employer] [place] [id number] September 17, 1985  Referring provider: Carlean Jews, NP Primary GI doctor: Dr. 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 pylori gastritis Patient is completely changed his diet to eat once daily with more liquids than solids, has lost 200 pounds since 2019 From his description it sounds more like it could be a gastroparesis. Question global paresis with constipation and stomach Will get gastric emptying study Gastroparesis diet given Can consider repeat endoscopy but no reflux or significant symptoms as long as patient follows diet. May benefit from Motegrity if gastric emptying study positive   Patient Care Team: Carlean Jews, NP as PCP - General (Family Medicine) Christell Constant, MD as PCP - Cardiology (Cardiology)  HISTORY OF PRESENT ILLNESS: 37 y.o. male with a past medical history of anxiety, HTN, OSA, depression, obesity, GERD, and others listed below presents for evaluation of IBS flare.   - 02/2018: EGD ([facility]): mild reflux esophagitis, biopsies negative for Barrett's esophagus and eosinophilic esophagitis. Biopsies from the small bowel negative for celiac disease. - 04/11/2020: Evaluated by [facility] for abdominal pain, cramping, gas, bloating, and spasms. At that time discussed similar symptoms in 2019.  Recommended sertraline for treatment of underlying anxiety, but patient  stopped due to weight gain. - 11/16/2020: Initial appointment in [facility] for evaluation of generalized abdominal cramping, gas, borborygmi, changes in bowel habits (constipation with BM every 3-4 days).  Reportedly trialed high-fiber diet but that resulted in BRBPR.  Took MiraLAX but stopped after 1 dose due to watery stools.  Strong association with underlying anxiety and GI symptoms, with history of panic attacks.   Will schedule for EGD/colonoscopy at [facility] due to morbid obesity - 03/09/2021: EGD: Normal esophagus, stomach (path: Chronic non-H. pylori gastritis).  10 mm nodule in D2 (path: Peptic duodenitis).  Remainder of the duodenum was normal.  Recommended Prilosec 40 mg p.o. twice daily x 6 weeks then titrate to lowest effective dose - 03/09/2021: Colonoscopy: Normal, small internal hemorrhoids.  Mild inflammatory changes at the IC valve, but remainder of the ileum was normal (path: Patchy, mild active ileitis without features of chronicity).   Repeat colonoscopy at age 66 for ongoing screening  06/22/2022 office visit with Dr. Barron Alvine for generalized abdominal pain, constipation.  Given trial of Amitiza 24 mcg twice daily, refilled Bentyl 10 mg.  Discussed hemorrhoidal band ligation. Patient has been on FD guard and IBgard, peppermint oil. Has tried and failed MiraLAX, Dulcolax, beet juice.  States he has felt he has had slow digestions since 2019.  He was 89 in 2019 and he has lost 260-270 lbs.  He had nausea but vomiting, felt hot at night but no objective fever,chills.  Rare GERD, once in the last 2 weeks since he changed his diet.  He has not been taking the protonix. He has a lot of burping/beltching/ and is passing a lot of gas. He has throbbing upper AB pain after not having BM for 2 weeks.   He has only been taking the [place] once a  day.  08/21/2022 CT AB and pelvis with contrast showed no acute intra abdominal process but showed increased stool burden.  He went  home and started on dulcolax, he had large Bm's Tuesday night after the ER and the upper AB pain resolved.  He has not had a BM since that time, the upper AB pain was returning Friday evening, took another laxative around 1 yesterday.  He only eats once a day, states he gets full so quickly, he feels the food just sits there and does not move as it should. Will drink liquid.  If he eats after 5 pm he will feel badly.   He  reports that he quit smoking about 7 years ago. His smoking use included cigarettes and cigars. He has never used smokeless tobacco. He reports that he does not drink alcohol and does not use drugs.  RELEVANT LABS AND IMAGING: CBC    Component Value Date/Time   WBC 4.8 08/21/2022 1133   RBC 4.83 08/21/2022 1133   HGB 14.7 08/21/2022 1133   HGB 16.1 05/25/2021 1002   HCT 44.2 08/21/2022 1133   HCT 47.0 05/25/2021 1002   PLT 164 08/21/2022 1133   PLT 196 05/25/2021 1002   MCV 91.5 08/21/2022 1133   MCV 86 05/25/2021 1002   MCH 30.4 08/21/2022 1133   MCHC 33.3 08/21/2022 1133   RDW 12.3 08/21/2022 1133   RDW 12.3 05/25/2021 1002   Recent Labs    08/21/22 1133  HGB 14.7    CMP     Component Value Date/Time   NA 136 08/21/2022 1133   NA 137 05/25/2021 1002   K 4.2 08/21/2022 1133   CL 101 08/21/2022 1133   CO2 26 08/21/2022 1133   GLUCOSE 88 08/21/2022 1133   BUN 13 08/21/2022 1133   BUN 15 05/25/2021 1002   CREATININE 0.86 08/21/2022 1133   CALCIUM 9.2 08/21/2022 1133   PROT 7.3 08/21/2022 1133   PROT 7.7 05/25/2021 1002   ALBUMIN 4.2 08/21/2022 1133   ALBUMIN 4.5 05/25/2021 1002   AST 28 08/21/2022 1133   ALT 23 08/21/2022 1133   ALKPHOS 43 08/21/2022 1133   BILITOT 1.1 08/21/2022 1133   BILITOT 0.7 05/25/2021 1002   GFRNONAA >60 08/21/2022 1133      Latest Ref Rng & Units 08/21/2022   11:33 AM 05/25/2021   10:02 AM  Hepatic Function  Total Protein 6.5 - 8.1 g/dL 7.3  7.7   Albumin 3.5 - 5.0 g/dL 4.2  4.5   AST 15 - 41 U/L 28  36   ALT 0 -  44 U/L 23  29   Alk Phosphatase 38 - 126 U/L 43  74   Total Bilirubin 0.3 - 1.2 mg/dL 1.1  0.7       Current Medications:    Current Outpatient Medications (Cardiovascular):    propranolol ER (INDERAL LA) 60 MG 24 hr capsule, Take 60 mg by mouth daily.    Current Outpatient Medications (Hematological):    Cyanocobalamin (B-12 PO), Take 1 Dose by mouth daily as needed (energy). Liquid   ferrous sulfate 220 (44 Fe) MG/5ML solution, Take 220 mg by mouth as needed.  Current Outpatient Medications (Other):    clonazePAM (KLONOPIN) 0.5 MG tablet, Take 0.5 mg by mouth 2 (two) times daily as needed for anxiety.   dicyclomine (BENTYL) 10 MG capsule, Take 1 capsule (10 mg total) by mouth every 6 (six) hours as needed (abdominal cramping).   Digestive Enzyme CAPS, Take  2 capsules by mouth daily.   linaclotide (LINZESS) 290 MCG CAPS capsule, Take 1 capsule (290 mcg total) by mouth daily before breakfast.   MAGNESIUM PO, Take 1 tablet by mouth at bedtime.   pantoprazole (PROTONIX) 20 MG tablet, Take 1 tablet (20 mg total) by mouth daily.   Vitamin D-Vitamin K (VITAMIN K2-VITAMIN D3 PO), Take 1 capsule by mouth daily.  Medical History:  Past Medical History:  Diagnosis Date   Anxiety disorder    Constipation    GERD (gastroesophageal reflux disease)    Hypertension    Obesity    OSA (obstructive sleep apnea)    Allergies: No Known Allergies   Surgical History:  He  has a past surgical history that includes Colonoscopy with propofol (N/A, 03/09/2021); Esophagogastroduodenoscopy (egd) with propofol (N/A, 03/09/2021); biopsy (03/09/2021); and Cholecystectomy (2012). Family History:  His family history includes Diabetes in his father; Hypertension in his father and mother; Other in his maternal grandfather; Thyroid disease in his maternal grandfather and mother.  REVIEW OF SYSTEMS  : All other systems reviewed and negative except where noted in the History of Present Illness.  PHYSICAL  EXAM: BP 104/70 (BP Location: Left Arm, Patient Position: Sitting, Cuff Size: Large)   Pulse 64   Ht 5\' 7"  (1.702 m)   Wt 239 lb 4 oz (108.5 kg)   BMI 37.47 kg/m  General Appearance: Well nourished, in no apparent distress. Head:   Normocephalic and atraumatic. Eyes:  sclerae anicteric,conjunctive pink  Respiratory: Respiratory effort normal, BS equal bilaterally without rales, rhonchi, wheezing. Cardio: RRR with no MRGs. Peripheral pulses intact.  Abdomen: Soft,  Non-distended ,active bowel sounds. mild tenderness in the LUQ. Without guarding and Without rebound. No masses. Rectal: Not evaluated Musculoskeletal: Full ROM, Normal gait. Without edema. Skin:  Dry and intact without significant lesions or rashes Neuro: Alert and  oriented x4;  No focal deficits. Psych:  Cooperative. Normal mood and affect.    Doree Albee, PA-C 11:13 AM

## 2022-08-27 ENCOUNTER — Encounter: Payer: Self-pay | Admitting: Physician Assistant

## 2022-08-27 ENCOUNTER — Ambulatory Visit: Payer: 59 | Admitting: Physician Assistant

## 2022-08-27 VITALS — BP 104/70 | HR 64 | Ht 67.0 in | Wt 239.2 lb

## 2022-08-27 DIAGNOSIS — R109 Unspecified abdominal pain: Secondary | ICD-10-CM | POA: Diagnosis not present

## 2022-08-27 DIAGNOSIS — R6881 Early satiety: Secondary | ICD-10-CM

## 2022-08-27 DIAGNOSIS — K219 Gastro-esophageal reflux disease without esophagitis: Secondary | ICD-10-CM

## 2022-08-27 DIAGNOSIS — R634 Abnormal weight loss: Secondary | ICD-10-CM

## 2022-08-27 DIAGNOSIS — K581 Irritable bowel syndrome with constipation: Secondary | ICD-10-CM

## 2022-08-27 DIAGNOSIS — K641 Second degree hemorrhoids: Secondary | ICD-10-CM | POA: Diagnosis not present

## 2022-08-27 MED ORDER — LINACLOTIDE 290 MCG PO CAPS: 290.0000 ug | ORAL_CAPSULE | Freq: Every day | ORAL | 0 refills | Status: DC

## 2022-08-27 NOTE — Patient Instructions (Addendum)
Linzess 290 mcg Let us know when one you do better with OR if you do not do well we will try isbrella or motegrity *IBS-C patients may begin to experience relief from belly pain and overall abdominal symptoms (pain, discomfort, and bloating) in about 1 week,  with symptoms typically improving over 12 weeks.  Take at least 30 minutes before the first meal of the day on an empty stomach You can have a loose stool if you eat a high-fat breakfast. Give it at least 7 days, may have more bowel movements during that time.   The diarrhea should go away and you should start having normal, complete, full bowel movements.  It may be helpful to start treatment when you can be near the comfort of your own bathroom, such as a weekend.  After you are out we can send in a prescription if you did well, there is a prescription card  Recommend starting on a fiber supplement, can try metamucil first but if this causes gas/bloating switch to benefiber or citracel, these do not cause gas.  Take with fiber with with a full 8 oz glass of water once a day. This can take 1 month to start helping, so try for at least one month.  Recommend increasing water and physical activity.   - Drink at least 64-80 ounces of water/liquid per day. - Establish a time to try to move your bowels every day.  For many people, this is after a cup of coffee or after a meal such as breakfast. - Sit all of the way back on the toilet keeping your back fairly straight and while sitting up, try to rest the tops of your forearms on your upper thighs.   - Raising your feet with a step stool/squatty potty can be helpful to improve the angle that allows your stool to pass through the rectum. - Relax the rectum feeling it bulge toward the toilet water.  If you feel your rectum raising toward your body, you are contracting rather than relaxing. - Breathe in and slowly exhale. "Belly breath" by expanding your belly towards your belly button. Keep belly  expanded as you gently direct pressure down and back to the anus.  A low pitched GRRR sound can assist with increasing intra-abdominal pressure.  (Can also trying to blow on a pinwheel and make it move, this helps with the same belly breathing) - Repeat 3-4 times. If unsuccessful, contract the pelvic floor to restore normal tone and get off the toilet.  Avoid excessive straining. - To reduce excessive wiping by teaching your anus to normally contract, place hands on outer aspect of knees and resist knee movement outward.  Hold 5-10 second then place hands just inside of knees and resist inward movement of knees.  Hold 5 seconds.  Repeat a few times each way.  Go to the ER if unable to pass gas, severe AB pain, unable to hold down food, any shortness of breath of chest pain.   Gastroparesis Please do small frequent meals like 4-6 meals a day.  Eat and drink liquids at separate times.  Avoid high fiber foods, cook your vegetables, avoid high fat food.  Suggest spreading protein throughout the day (greek yogurt, glucerna, soft meat, milk, eggs) Choose soft foods that you can mash with a fork When you are more symptomatic, change to pureed foods foods and liquids.  Consider reading "Living well with Gastroparesis" by Reuel Derby Gastroparesis is a condition in which food takes longer  than normal to empty from the stomach. This condition is also known as delayed gastric emptying. It is usually a long-term (chronic) condition. There is no cure, but there are treatments and things that you can do at home to help relieve symptoms. Treating the underlying condition that causes gastroparesis can also help relieve symptoms What are the causes? In many cases, the cause of this condition is not known. Possible causes include: A hormone (endocrine) disorder, such as hypothyroidism or diabetes. A nervous system disease, such as Parkinson's disease or multiple sclerosis. Cancer, infection, or surgery  that affects the stomach or vagus nerve. The vagus nerve runs from your chest, through your neck, and to the lower part of your brain. A connective tissue disorder, such as scleroderma. Certain medicines. What increases the risk? You are more likely to develop this condition if: You have certain disorders or diseases. These may include: An endocrine disorder. An eating disorder. Amyloidosis. Scleroderma. Parkinson's disease. Multiple sclerosis. Cancer or infection of the stomach or the vagus nerve. You have had surgery on your stomach or vagus nerve. You take certain medicines. You are male. What are the signs or symptoms? Symptoms of this condition include: Feeling full after eating very little or a loss of appetite. Nausea, vomiting, or heartburn. Bloating of your abdomen. Inconsistent blood sugar (glucose) levels on blood tests. Unexplained weight loss. Acid from the stomach coming up into the esophagus (gastroesophageal reflux). Sudden tightening (spasm) of the stomach, which can be painful. Symptoms may come and go. Some people may not notice any symptoms. How is this diagnosed? This condition is diagnosed with tests, such as: Tests that check how long it takes food to move through the stomach and intestines. These tests include: Upper gastrointestinal (GI) series. For this test, you drink a liquid that shows up well on X-rays, and then X-rays are taken of your intestines. Gastric emptying scintigraphy. For this test, you eat food that contains a small amount of radioactive material, and then scans are taken. Wireless capsule GI monitoring system. For this test, you swallow a pill (capsule) that records information about how foods and fluid move through your stomach. Gastric manometry. For this test, a tube is passed down your throat and into your stomach to measure electrical and muscular activity. Endoscopy. For this test, a long, thin tube with a camera and light on the end  is passed down your throat and into your stomach to check for problems in your stomach lining. Ultrasound. This test uses sound waves to create images of the inside of your body. This can help rule out gallbladder disease or pancreatitis as a cause of your symptoms. How is this treated? There is no cure for this condition, but treatment and home care may relieve symptoms. Treatment may include: Treating the underlying cause. Managing your symptoms by making changes to your diet and exercise habits. Taking medicines to control nausea and vomiting and to stimulate stomach muscles. Getting food through a feeding tube in the hospital. This may be done in severe cases. Having surgery to insert a device called a gastric electrical stimulator into your body. This device helps improve stomach emptying and control nausea and vomiting. Follow these instructions at home: Take over-the-counter and prescription medicines only as told by your health care provider. Follow instructions from your health care provider about eating or drinking restrictions. Your health care provider may recommend that you: Eat smaller meals more often. Eat low-fat foods. Eat low-fiber forms of high-fiber foods. For example,  eat cooked vegetables instead of raw vegetables. Have only liquid foods instead of solid foods. Liquid foods are easier to digest. Drink enough fluid to keep your urine pale yellow. Exercise as often as told by your health care provider. Keep all follow-up visits. This is important. Contact a health care provider if you: Notice that your symptoms do not improve with treatment. Have new symptoms. Get help right away if you: Have severe pain in your abdomen that does not improve with treatment. Have nausea that is severe or does not go away. Vomit every time you drink fluids. Summary Gastroparesis is a long-term (chronic) condition in which food takes longer than normal to empty from the stomach. Symptoms  include nausea, vomiting, heartburn, bloating of your abdomen, and loss of appetite. Eating smaller portions, low-fat foods, and low-fiber forms of high-fiber foods may help you manage your symptoms. Get help right away if you have severe pain in your abdomen. This information is not intended to replace advice given to you by your health care provider. Make sure you discuss any questions you have with your health care provider. Document Revised: 07/06/2019 Document Reviewed: 07/06/2019 Elsevier Patient Education  2021 Elsevier Inc.   You have been scheduled for a gastric emptying scan at [facility] on 09/14/2022 at 7am. Please arrive at least 30 minutes prior to your appointment for registration. Please make certain not to have anything to eat or drink after midnight the night before your test. Hold all stomach medications (ex: Zofran, phenergan, Reglan) 24 hours prior to your test. If you need to reschedule your appointment, please contact radiology scheduling at [phone]. _____________________________________________________________________ A gastric-emptying study measures how long it takes for food to move through your stomach. There are several ways to measure stomach emptying. In the most common test, you eat food that contains a small amount of radioactive material. A scanner that detects the movement of the radioactive material is placed over your abdomen to monitor the rate at which food leaves your stomach. This test normally takes about 4 hours to complete. _____________________________________________________________________   Due to recent changes in healthcare laws, you may see the results of your imaging and laboratory studies on MyChart before your provider has had a chance to review them.  We understand that in some cases there may be results that are confusing or concerning to you. Not all laboratory results come back in the same time frame and the provider may be  waiting for multiple results in order to interpret others.  Please give [place] 48 hours in order for your provider to thoroughly review all the results before contacting the office for clarification of your results.    I appreciate the  opportunity to care for you  Thank You   Quentin Mulling, PA-C

## 2022-08-31 ENCOUNTER — Telehealth: Payer: Self-pay | Admitting: Pharmacy Technician

## 2022-08-31 NOTE — Telephone Encounter (Signed)
Patient Advocate Encounter  Received notification from [facility] that prior authorization for LINZESS is required.   PA submitted on 6.21.24 Key [id number] Status is pending

## 2022-09-07 NOTE — Telephone Encounter (Signed)
PA Approved through 08/31/23

## 2022-09-14 ENCOUNTER — Encounter (HOSPITAL_COMMUNITY)
Admission: RE | Admit: 2022-09-14 | Discharge: 2022-09-14 | Disposition: A | Discharge status: Home / Self Care | Payer: 59 | Source: Ambulatory Visit | Attending: Physician Assistant | Admitting: Physician Assistant

## 2022-09-14 DIAGNOSIS — R6881 Early satiety: Secondary | ICD-10-CM | POA: Insufficient documentation

## 2022-09-14 DIAGNOSIS — R634 Abnormal weight loss: Secondary | ICD-10-CM | POA: Insufficient documentation

## 2022-09-14 DIAGNOSIS — K219 Gastro-esophageal reflux disease without esophagitis: Secondary | ICD-10-CM | POA: Diagnosis present

## 2022-09-14 MED ORDER — TECHNETIUM TC 99M SULFUR COLLOID
1.9300 | Freq: Once | INTRAVENOUS | Status: AC
  Administered 2022-09-14: 1.93 via ORAL

## 2022-09-14 NOTE — Progress Notes (Signed)
Agree with the assessment and plan as outlined by Amanda Collier, PA-C. ? ?Lakia Gritton, DO, FACG ? ?

## 2022-10-08 ENCOUNTER — Ambulatory Visit: Payer: 59 | Admitting: Internal Medicine

## 2022-11-14 ENCOUNTER — Other Ambulatory Visit (INDEPENDENT_AMBULATORY_CARE_PROVIDER_SITE_OTHER): Payer: 59

## 2022-11-14 ENCOUNTER — Ambulatory Visit: Payer: 59 | Admitting: Physician Assistant

## 2022-11-14 ENCOUNTER — Encounter: Payer: Self-pay | Admitting: Physician Assistant

## 2022-11-14 VITALS — BP 140/82 | HR 86 | Ht 68.0 in | Wt 235.0 lb

## 2022-11-14 DIAGNOSIS — R6881 Early satiety: Secondary | ICD-10-CM | POA: Diagnosis not present

## 2022-11-14 DIAGNOSIS — R109 Unspecified abdominal pain: Secondary | ICD-10-CM

## 2022-11-14 DIAGNOSIS — R634 Abnormal weight loss: Secondary | ICD-10-CM

## 2022-11-14 DIAGNOSIS — K581 Irritable bowel syndrome with constipation: Secondary | ICD-10-CM

## 2022-11-14 LAB — TSH: TSH: 1.25 u[IU]/mL (ref 0.35–5.50)

## 2022-11-14 LAB — CBC WITH DIFFERENTIAL/PLATELET
Basophils Absolute: 0 10*3/uL (ref 0.0–0.1)
Basophils Relative: 0.4 % (ref 0.0–3.0)
Eosinophils Absolute: 0.2 10*3/uL (ref 0.0–0.7)
Eosinophils Relative: 3.7 % (ref 0.0–5.0)
HCT: 42.7 % (ref 39.0–52.0)
Hemoglobin: 14.3 g/dL (ref 13.0–17.0)
Lymphocytes Relative: 34.5 % (ref 12.0–46.0)
Lymphs Abs: 1.9 10*3/uL (ref 0.7–4.0)
MCHC: 33.4 g/dL (ref 30.0–36.0)
MCV: 91.6 fl (ref 78.0–100.0)
Monocytes Absolute: 0.4 10*3/uL (ref 0.1–1.0)
Monocytes Relative: 6.5 % (ref 3.0–12.0)
Neutro Abs: 3.1 10*3/uL (ref 1.4–7.7)
Neutrophils Relative %: 54.9 % (ref 43.0–77.0)
Platelets: 183 10*3/uL (ref 150.0–400.0)
RBC: 4.66 Mil/uL (ref 4.22–5.81)
RDW: 12.8 % (ref 11.5–15.5)
WBC: 5.6 10*3/uL (ref 4.0–10.5)

## 2022-11-14 LAB — COMPREHENSIVE METABOLIC PANEL
ALT: 22 U/L (ref 0–53)
AST: 24 U/L (ref 0–37)
Albumin: 4 g/dL (ref 3.5–5.2)
Alkaline Phosphatase: 44 U/L (ref 39–117)
BUN: 21 mg/dL (ref 6–23)
CO2: 30 meq/L (ref 19–32)
Calcium: 9.1 mg/dL (ref 8.4–10.5)
Chloride: 104 meq/L (ref 96–112)
Creatinine, Ser: 1.04 mg/dL (ref 0.40–1.50)
GFR: 93.27 mL/min (ref >60.00)
Glucose, Bld: 67 mg/dL — ABNORMAL LOW (ref 70–99)
Potassium: 4.3 meq/L (ref 3.5–5.1)
Sodium: 139 meq/L (ref 135–145)
Total Bilirubin: 0.6 mg/dL (ref 0.2–1.2)
Total Protein: 7.1 g/dL (ref 6.0–8.3)

## 2022-11-14 LAB — HIGH SENSITIVITY CRP: CRP, High Sensitivity: 1 mg/L (ref 0.000–5.000)

## 2022-11-14 LAB — SEDIMENTATION RATE: Sed Rate: 12 mm/h (ref 0–15)

## 2022-11-14 MED ORDER — LINACLOTIDE 145 MCG PO CAPS: 145.0000 ug | ORAL_CAPSULE | Freq: Every day | ORAL | 3 refills | Status: DC

## 2022-11-14 MED ORDER — FAMOTIDINE 40 MG PO TABS: 40.0000 mg | ORAL_TABLET | Freq: Every day | ORAL | 1 refills | Status: DC

## 2022-11-14 MED ORDER — DICYCLOMINE HCL 10 MG PO CAPS: 10.0000 mg | ORAL_CAPSULE | Freq: Four times a day (QID) | ORAL | 5 refills | Status: DC | PRN

## 2022-11-14 NOTE — Progress Notes (Signed)
11/14/2022 [employer] [id number] 10/27/1985  Referring provider: Carlean Jews, NP Primary GI doctor: Dr. 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 pylori gastritis 2024 GES negative Patient is completely changed his diet to eat once daily with more liquids than solids, has lost 200 pounds since 2019 Symptoms have improved, with improvement in his constipation Will refill Pepcid as needed  Will follow up 6 months, will call back sooner if any issues.    Patient Care Team: Carlean Jews, NP as PCP - General (Family Medicine) Christell Constant, MD as PCP - Cardiology (Cardiology)  HISTORY OF PRESENT ILLNESS: 37 y.o. male with a past medical history of anxiety, HTN, OSA, depression, obesity, GERD, and others listed below presents for evaluation of IBS flare.   - 02/2018: EGD ([facility]): mild reflux esophagitis, biopsies negative for Barrett's esophagus and eosinophilic esophagitis. Biopsies from the small bowel negative for celiac disease. - 04/11/2020: Evaluated by [facility] for abdominal pain, cramping, gas, bloating, and spasms. At that time discussed similar symptoms in 2019.  Recommended sertraline for treatment of underlying anxiety, but patient stopped due to weight gain. - 11/16/2020: Initial appointment in [facility] for evaluation of generalized abdominal cramping, gas,  borborygmi, changes in bowel habits (constipation with BM every 3-4 days).  Reportedly trialed high-fiber diet but that resulted in BRBPR.  Took MiraLAX but stopped after 1 dose due to watery stools.  Strong association with underlying anxiety and GI symptoms, with history of panic attacks.   EGD/colonoscopy at [facility] due to morbid obesity - 03/09/2021: EGD: Normal esophagus, stomach (path: Chronic non-H. pylori gastritis).  10 mm nodule in D2 (path: Peptic duodenitis).  Remainder of the duodenum was normal.  Recommended Prilosec 40 mg p.o. twice daily x 6 weeks then titrate to lowest effective dose - 03/09/2021: Colonoscopy: Normal, small internal hemorrhoids.  Mild inflammatory changes at the IC valve, but remainder of the ileum was normal (path: Patchy, mild active ileitis without features of chronicity).   Repeat colonoscopy at age 67 for ongoing screening  06/22/2022 office visit with Dr. Barron Alvine for generalized abdominal pain, constipation.  Given trial of Amitiza 24 mcg twice daily, refilled Bentyl 10 mg.  Discussed hemorrhoidal band ligation. Patient has been on FD guard and IBgard, peppermint oil. Has tried and failed MiraLAX, Dulcolax, beet juice. 08/27/2022 patient seen in the office by myself with complaint of slow digestion lost to 60 to 70 pounds since 2019 a lot of burping, belching throbbing upper abdominal pain worse with constipation. 08/21/2022 CT AB and pelvis with contrast showed no acute intra abdominal process but showed increased stool burden.  Had unremarkable gastric emptying study. At that visit he was started on Linzess 290 mcg.    He states he was on linzess 290 mcg every other day due to diarrhea.  He states the AB pain/beltching has improved significantly with the linzess, he states he takes the dicyclomine at night and this helps significantly  with his cramping.  He is on pepcid as needed for GERD, only is on 5 x a month. He denies significant GERD unless he eats  something spicy and well controlled with GERD. No melena no dysphagia.  He does not drink ETOH, no drug use, no tobacco. No NSAIDS, uses tylenol for headaches.   He  reports that he quit smoking about 7 years ago. His smoking use included cigarettes and cigars. He has never used smokeless tobacco. He reports that he does not drink alcohol and does not use drugs.   Wt Readings from Last 3 Encounters:  11/14/22 235 lb (106.6 kg)  08/27/22 239 lb 4 oz (108.5 kg)  08/21/22 250 lb (113.4 kg)     RELEVANT LABS AND IMAGING: CBC    Component Value Date/Time   WBC 4.8 08/21/2022 1133   RBC 4.83 08/21/2022 1133   HGB 14.7 08/21/2022 1133   HGB 16.1 05/25/2021 1002   HCT 44.2 08/21/2022 1133   HCT 47.0 05/25/2021 1002   PLT 164 08/21/2022 1133   PLT 196 05/25/2021 1002   MCV 91.5 08/21/2022 1133   MCV 86 05/25/2021 1002   MCH 30.4 08/21/2022 1133   MCHC 33.3 08/21/2022 1133   RDW 12.3 08/21/2022 1133   RDW 12.3 05/25/2021 1002   Recent Labs    08/21/22 1133  HGB 14.7    CMP     Component Value Date/Time   NA 136 08/21/2022 1133   NA 137 05/25/2021 1002   K 4.2 08/21/2022 1133   CL 101 08/21/2022 1133   CO2 26 08/21/2022 1133   GLUCOSE 88 08/21/2022 1133   BUN 13 08/21/2022 1133   BUN 15 05/25/2021 1002   CREATININE 0.86 08/21/2022 1133   CALCIUM 9.2 08/21/2022 1133   PROT 7.3 08/21/2022 1133   PROT 7.7 05/25/2021 1002   ALBUMIN 4.2 08/21/2022 1133   ALBUMIN 4.5 05/25/2021 1002   AST 28 08/21/2022 1133   ALT 23 08/21/2022 1133   ALKPHOS 43 08/21/2022 1133   BILITOT 1.1 08/21/2022 1133   BILITOT 0.7 05/25/2021 1002   GFRNONAA >60 08/21/2022 1133      Latest Ref Rng & Units 08/21/2022   11:33 AM 05/25/2021   10:02 AM  Hepatic Function  Total Protein 6.5 - 8.1 g/dL 7.3  7.7   Albumin 3.5 - 5.0 g/dL 4.2  4.5   AST 15 - 41 U/L 28  36   ALT 0 - 44 U/L 23  29   Alk Phosphatase 38 - 126 U/L 43  74   Total Bilirubin 0.3 - 1.2 mg/dL 1.1  0.7       Current  Medications:    Current Outpatient Medications (Cardiovascular):    propranolol ER (INDERAL LA) 60 MG 24 hr capsule, Take 60 mg by mouth daily.    Current Outpatient Medications (Hematological):    Cyanocobalamin (B-12 PO), Take 1 Dose by mouth daily as needed (energy). Liquid   ferrous sulfate 220 (44 Fe) MG/5ML solution, Take 220 mg by mouth as needed.  Current Outpatient Medications (Other):    clonazePAM (KLONOPIN) 0.5 MG tablet, Take 0.5 mg by mouth 2 (two) times daily as needed for anxiety.   dicyclomine (BENTYL) 10 MG capsule, Take 1 capsule (10 mg total) by mouth every 6 (six) hours as needed (abdominal cramping).   Digestive Enzyme CAPS, Take 2 capsules by mouth daily.   famotidine (PEPCID) 40 MG tablet, Take 1 tablet (40 mg total) by mouth daily.  linaclotide (LINZESS) 145 MCG CAPS capsule, Take 1 capsule (145 mcg total) by mouth daily before breakfast.   MAGNESIUM PO, Take 1 tablet by mouth at bedtime.   pantoprazole (PROTONIX) 20 MG tablet, Take 1 tablet (20 mg total) by mouth daily.   Vitamin D-Vitamin K (VITAMIN K2-VITAMIN D3 PO), Take 1 capsule by mouth daily.  Medical History:  Past Medical History:  Diagnosis Date   Anxiety disorder    Constipation    GERD (gastroesophageal reflux disease)    Hypertension    Obesity    OSA (obstructive sleep apnea)    Allergies: No Known Allergies   Surgical History:  He  has a past surgical history that includes Colonoscopy with propofol (N/A, 03/09/2021); Esophagogastroduodenoscopy (egd) with propofol (N/A, 03/09/2021); biopsy (03/09/2021); and Cholecystectomy (2012). Family History:  His family history includes Diabetes in his father; Hypertension in his father and mother; Other in his maternal grandfather; Thyroid disease in his maternal grandfather and mother.  REVIEW OF SYSTEMS  : All other systems reviewed and negative except where noted in the History of Present Illness.  PHYSICAL EXAM: BP (!) 140/82   Pulse 86    Ht 5\' 8"  (1.727 m)   Wt 235 lb (106.6 kg)   BMI 35.73 kg/m  General Appearance: Well nourished, in no apparent distress. Head:   Normocephalic and atraumatic. Eyes:  sclerae anicteric,conjunctive pink  Respiratory: Respiratory effort normal, BS equal bilaterally without rales, rhonchi, wheezing. Cardio: RRR with no MRGs. Peripheral pulses intact.  Abdomen: Soft,  Non-distended ,active bowel sounds. mild tenderness in the LUQ. Without guarding and Without rebound. No masses. Rectal: Not evaluated Musculoskeletal: Full ROM, Normal gait. Without edema. Skin:  Dry and intact without significant lesions or rashes Neuro: Alert and  oriented x4;  No focal deficits. Psych:  Cooperative. Normal mood and affect.    Doree Albee, PA-C 2:48 PM

## 2022-11-14 NOTE — Patient Instructions (Addendum)
Your provider has requested that you go to the basement level for lab work before leaving today. Press "B" on the elevator. The lab is located at the first door on the left as you exit the elevator.  Please give [place] a call in 6 months for a follow up.    Linzess try the linzess 72 mcg, can increase to the 145 mcg and take this daily or every other day.  *IBS-C patients may begin to experience relief from belly pain and overall abdominal symptoms (pain, discomfort, and bloating) in about 1 week,  with symptoms typically improving over 12 weeks.  Take at least 30 minutes before the first meal of the day on an empty stomach You can have a loose stool if you eat a high-fat breakfast. Give it at least 7 days, may have more bowel movements during that time.   The diarrhea should go away and you should start having normal, complete, full bowel movements.  It may be helpful to start treatment when you can be near the comfort of your own bathroom, such as a weekend.  After you are out we can send in a prescription if you did well, there is a prescription card  Can try the pepcid at night for 4 weeks.    FODMAP stands for fermentable oligo-, di-, mono-saccharides and polyols (1). These are the scientific terms used to classify groups of carbs that are difficult for our body to digest and that are notorious for triggering digestive symptoms like bloating, gas, loose stools and stomach pain.   You can try low FODMAP diet  - start with eliminating just one column at a time that you feel may be a trigger for you. - the table at the very bottom contains foods that are low in FODMAPs   Sometimes trying to eliminate the FODMAP's from your diet is difficult or tricky, if you are stuggling with trying to do the elimination diet you can try an enzyme.  There is a food enzymes that you sprinkle in or on your food that helps break down the FODMAP. You can read more about the enzyme by going to this  site: https://fodzyme.com/  _______________________________________________________  If your blood pressure at your visit was 140/90 or greater, please contact your primary care physician to follow up on this.  _______________________________________________________  If you are age 31 or older, your body mass index should be between 23-30. Your Body mass index is 35.73 kg/m. If this is out of the aforementioned range listed, please consider follow up with your Primary Care Provider.  If you are age 72 or younger, your body mass index should be between 19-25. Your Body mass index is 35.73 kg/m. If this is out of the aformentioned range listed, please consider follow up with your Primary Care Provider.   ________________________________________________________  The [place] [facility] providers would like to encourage you to use [facility] to communicate with providers for non-urgent requests or questions.  Due to long hold times on the telephone, sending your provider a message by [facility] may be a faster and more efficient way to get a response.  Please allow 48 business hours for a response.  Please remember that this is for non-urgent requests.  _______________________________________________________ It was a pleasure to see you today!  Thank you for trusting me with your gastrointestinal care!

## 2022-11-28 NOTE — Progress Notes (Signed)
Agree with the assessment and plan as outlined by Quentin Mulling, PA-C. ? ?Brigitte Soderberg, DO, FACG ? ?

## 2022-11-30 ENCOUNTER — Other Ambulatory Visit: Payer: Self-pay | Admitting: Physician Assistant

## 2023-01-18 ENCOUNTER — Encounter: Payer: Self-pay | Admitting: Gastroenterology

## 2023-04-25 ENCOUNTER — Ambulatory Visit: Payer: No Typology Code available for payment source | Admitting: Internal Medicine

## 2023-05-07 ENCOUNTER — Ambulatory Visit: Payer: 59 | Admitting: Family

## 2023-05-23 ENCOUNTER — Encounter: Payer: Self-pay | Admitting: Physician Assistant

## 2023-05-23 ENCOUNTER — Ambulatory Visit: Payer: 59 | Admitting: Physician Assistant

## 2023-05-23 VITALS — BP 122/80 | HR 74 | Ht 68.0 in | Wt 250.0 lb

## 2023-05-23 DIAGNOSIS — K581 Irritable bowel syndrome with constipation: Secondary | ICD-10-CM

## 2023-05-23 DIAGNOSIS — K219 Gastro-esophageal reflux disease without esophagitis: Secondary | ICD-10-CM

## 2023-05-23 DIAGNOSIS — R0602 Shortness of breath: Secondary | ICD-10-CM | POA: Diagnosis not present

## 2023-05-23 DIAGNOSIS — K641 Second degree hemorrhoids: Secondary | ICD-10-CM

## 2023-05-23 DIAGNOSIS — R000 Tachycardia, unspecified: Secondary | ICD-10-CM

## 2023-05-23 DIAGNOSIS — R0789 Other chest pain: Secondary | ICD-10-CM

## 2023-05-23 MED ORDER — TRULANCE 3 MG PO TABS: 3.0000 mg | ORAL_TABLET | Freq: Every day | ORAL | 0 refills | Status: DC

## 2023-05-23 NOTE — Patient Instructions (Addendum)
 Toileting tips to help with your constipation - Drink at least 64-80 ounces of water/liquid per day. - Establish a time to try to move your bowels every day.  For many people, this is after a cup of coffee or after a meal such as breakfast. - Sit all of the way back on the toilet keeping your back fairly straight and while sitting up, try to rest the tops of your forearms on your upper thighs.   - Raising your feet with a step stool/squatty potty can be helpful to improve the angle that allows your stool to pass through the rectum. - Relax the rectum feeling it bulge toward the toilet water.  If you feel your rectum raising toward your body, you are contracting rather than relaxing. - Breathe in and slowly exhale. "Belly breath" by expanding your belly towards your belly button. Keep belly expanded as you gently direct pressure down and back to the anus.  A low pitched GRRR sound can assist with increasing intra-abdominal pressure.  (Can also trying to blow on a pinwheel and make it move, this helps with the same belly breathing) - Repeat 3-4 times. If unsuccessful, contract the pelvic floor to restore normal tone and get off the toilet.  Avoid excessive straining. - To reduce excessive wiping by teaching your anus to normally contract, place hands on outer aspect of knees and resist knee movement outward.  Hold 5-10 second then place hands just inside of knees and resist inward movement of knees.  Hold 5 seconds.  Repeat a few times each way.  Go to the ER if unable to pass gas, severe AB pain, unable to hold down food, any shortness of breath of chest pain.  I appreciate the opportunity to care for you. Quentin Mulling, PA

## 2023-05-23 NOTE — Progress Notes (Signed)
 05/23/2023 [employer] [id number] Mar 10, 1986  Referring provider: Carlean Jews, NP Primary GI doctor: Dr. Barron Alvine  ASSESSMENT AND PLAN:   Irritable bowel syndrome with constipation Colon 2022 with mild active ileitis, no chronicity, likely NSAID related, recent normal ESR/CRP, recall age 38 08/21/2022 CT abdomen pelvis with contrast shows constipation otherwise unremarkable Patient's failed MiraLAX, fiber, has been taking Dulcolax, Amitiza without help. Was taking the 290 mcg every other day and having good results however he switched insurances and he has not been on it.  He has been going 4-5 days without BM and will take magnesium citrate Will do trial of trulance, will call if not covered IBS information given the patient continue Bentyl as needed  Gastroesophageal reflux disease with early satiety and weight loss 2019 EGD non-H. pylori gastritis 2024 GES negative Patient is completely changed his diet to eat once daily with more liquids than solids, has lost 200 pounds since 2019 Symptoms have improved, with improvement in his constipation Will refill Pepcid as needed, well controlled as needed with this  Chest tightness with associated SOB, fast heart beats and with flushing Normal stress test 06/05/2022 Burping helps Has history of elevated thyroid antibodies Has fear of autoimmune, had negative CRP/sed rate 11/2022, had normal thyroid Consider prilosec 20 mg daily Follow up with PCP for further evaluatoin Consider continuing daily anxiety medication  Will follow up 6 months, will call back sooner if any issues.    Patient Care Team: Carlean Jews, NP as PCP - General (Family Medicine) Christell Constant, MD as PCP - Cardiology (Cardiology)  HISTORY OF PRESENT ILLNESS: 38 y.o. male with a past medical history of anxiety, HTN, OSA, depression, obesity, GERD, and others listed below presents for evaluation of IBS flare.   - 02/2018: EGD ([facility]): mild reflux esophagitis, biopsies negative for Barrett's esophagus and eosinophilic esophagitis. Biopsies from the small bowel negative for celiac disease. - 04/11/2020: Evaluated by [facility] for abdominal pain, cramping, gas, bloating, and spasms. At that time discussed similar symptoms in 2019.  Recommended sertraline for treatment of underlying anxiety, but patient stopped due to weight gain. - 11/16/2020: Initial appointment in [facility] for evaluation of generalized abdominal cramping, gas, borborygmi, changes in bowel habits (constipation with BM every 3-4 days).  Reportedly trialed high-fiber diet but that resulted in BRBPR.  Took MiraLAX but stopped after 1 dose due to watery stools.  Strong association with underlying anxiety and GI symptoms, with history of panic attacks.   EGD/colonoscopy at [facility] due to morbid obesity - 03/09/2021: EGD: Normal esophagus, stomach (path: Chronic non-H. pylori gastritis).  10 mm nodule in D2 (path: Peptic duodenitis).  Remainder of the duodenum was normal.  Recommended Prilosec 40 mg p.o. twice daily x 6 weeks then titrate to lowest effective dose - 03/09/2021: Colonoscopy: Normal, small internal hemorrhoids.  Mild inflammatory changes at the IC valve, but remainder of the ileum was normal (path: Patchy, mild active ileitis without features of chronicity).   Repeat colonoscopy at age 4 for ongoing screening  06/22/2022 office visit with Dr. Barron Alvine for generalized abdominal pain, constipation.  Given trial of Amitiza 24 mcg twice daily, refilled Bentyl 10 mg.  Discussed hemorrhoidal band ligation. Patient has been on FD guard and IBgard, peppermint oil. Has tried and failed MiraLAX, Dulcolax, beet juice. 08/27/2022 patient seen in the office by myself with complaint of slow digestion lost to 60 to 70 pounds since 2019 a lot of burping,  belching throbbing upper abdominal pain worse with constipation. 08/21/2022 CT AB and pelvis  with contrast showed no acute intra abdominal process but showed increased stool burden.  Had unremarkable gastric emptying study. At that visit he was started on Linzess 290 mcg.    Discussed the use of AI scribe software for clinical note transcription with the patient, who gave verbal consent to proceed.  History of Present Illness   The patient presents with gastrointestinal symptoms and suspected autoimmune issues.  He experiences episodes of shortness of breath, chest tightness, and pressure radiating from his chest to his neck and sometimes down his arm. These episodes are somewhat relieved by burping, which alleviates some chest pressure. He describes these episodes as occurring for up to two weeks at a time, followed by a period of normalization, and they are sometimes accompanied by feeling flushed and hot in the face. No diarrhea or reflux is reported.  He has a history of constipation, using an oral solution as needed, typically after four to five days without a bowel movement. He has tried various treatments including Miralax, fiber, Dulcolax, and Amitiza, but found them ineffective. He recalls a past prescription for Amitiza in April 2024, which was also ineffective.  He suspects an autoimmune component to his symptoms, noting a family history of hypothyroidism affecting his grandfather, mother, and aunts. His thyroid function tests (T3, T4) were normal but slightly low, with elevated thyroid antibodies. He manages his condition with selenium and seaweed to maintain thyroid health. He experiences changes in symptoms with seasonal variations, noting different symptoms as the weather warms. He has a history of low blood platelets, which once dropped to 50,000, causing epistaxis, but later normalized to 150,000. He associates this with a potential autoimmune disorder.  He recalls a normal endoscopy in late 2022 or early 2023, which showed chronic gastritis and a small ulcer, with some  inflammation in the ileum but no chronicity. He has tried omeprazole in the past without significant relief.  He takes several supplements and medications including vitamin D3 with K2 (10,000 to 30,000 IU daily), magnesium glycinate at night, iron, and clonazepam as needed for anxiety. He also uses propranolol for blood pressure and anxiety, and famotidine as needed for gastrointestinal symptoms.       He  reports that he quit smoking about 8 years ago. His smoking use included cigarettes and cigars. He has never used smokeless tobacco. He reports that he does not drink alcohol and does not use drugs.   Wt Readings from Last 3 Encounters:  11/14/22 235 lb (106.6 kg)  08/27/22 239 lb 4 oz (108.5 kg)  08/21/22 250 lb (113.4 kg)     RELEVANT LABS AND IMAGING: CBC    Component Value Date/Time   WBC 5.6 11/14/2022 1456   RBC 4.66 11/14/2022 1456   HGB 14.3 11/14/2022 1456   HGB 16.1 05/25/2021 1002   HCT 42.7 11/14/2022 1456   HCT 47.0 05/25/2021 1002   PLT 183.0 11/14/2022 1456   PLT 196 05/25/2021 1002   MCV 91.6 11/14/2022 1456   MCV 86 05/25/2021 1002   MCH 30.4 08/21/2022 1133   MCHC 33.4 11/14/2022 1456   RDW 12.8 11/14/2022 1456   RDW 12.3 05/25/2021 1002   LYMPHSABS 1.9 11/14/2022 1456   MONOABS 0.4 11/14/2022 1456   EOSABS 0.2 11/14/2022 1456   BASOSABS 0.0 11/14/2022 1456   Recent Labs    08/21/22 1133 11/14/22 1456  HGB 14.7 14.3    CMP  Component Value Date/Time   NA 139 11/14/2022 1456   NA 137 05/25/2021 1002   K 4.3 11/14/2022 1456   CL 104 11/14/2022 1456   CO2 30 11/14/2022 1456   GLUCOSE 67 (L) 11/14/2022 1456   BUN 21 11/14/2022 1456   BUN 15 05/25/2021 1002   CREATININE 1.04 11/14/2022 1456   CALCIUM 9.1 11/14/2022 1456   PROT 7.1 11/14/2022 1456   PROT 7.7 05/25/2021 1002   ALBUMIN 4.0 11/14/2022 1456   ALBUMIN 4.5 05/25/2021 1002   AST 24 11/14/2022 1456   ALT 22 11/14/2022 1456   ALKPHOS 44 11/14/2022 1456   BILITOT 0.6 11/14/2022  1456   BILITOT 0.7 05/25/2021 1002   GFRNONAA >60 08/21/2022 1133      Latest Ref Rng & Units 11/14/2022    2:56 PM 08/21/2022   11:33 AM 05/25/2021   10:02 AM  Hepatic Function  Total Protein 6.0 - 8.3 g/dL 7.1  7.3  7.7   Albumin 3.5 - 5.2 g/dL 4.0  4.2  4.5   AST 0 - 37 U/L 24  28  36   ALT 0 - 53 U/L 22  23  29    Alk Phosphatase 39 - 117 U/L 44  43  74   Total Bilirubin 0.2 - 1.2 mg/dL 0.6  1.1  0.7       Current Medications:    Current Outpatient Medications (Cardiovascular):    propranolol ER (INDERAL LA) 60 MG 24 hr capsule, Take 60 mg by mouth daily.    Current Outpatient Medications (Hematological):    Cyanocobalamin (B-12 PO), Take 1 Dose by mouth daily as needed (energy). Liquid   ferrous sulfate 220 (44 Fe) MG/5ML solution, Take 220 mg by mouth as needed.  Current Outpatient Medications (Other):    clonazePAM (KLONOPIN) 0.5 MG tablet, Take 0.5 mg by mouth 2 (two) times daily as needed for anxiety.   dicyclomine (BENTYL) 10 MG capsule, Take 1 capsule (10 mg total) by mouth every 6 (six) hours as needed (abdominal cramping).   Digestive Enzyme CAPS, Take 2 capsules by mouth daily.   famotidine (PEPCID) 40 MG tablet, Take 1 tablet (40 mg total) by mouth daily.   MAGNESIUM PO, Take 1 tablet by mouth at bedtime.   pantoprazole (PROTONIX) 20 MG tablet, Take 1 tablet (20 mg total) by mouth daily.   Plecanatide (TRULANCE) 3 MG TABS, Take 1 tablet (3 mg total) by mouth daily.   Vitamin D-Vitamin K (VITAMIN K2-VITAMIN D3 PO), Take 1 capsule by mouth daily.  Medical History:  Past Medical History:  Diagnosis Date   Anxiety disorder    Constipation    GERD (gastroesophageal reflux disease)    Hypertension    IBS (irritable bowel syndrome)    Obesity    OSA (obstructive sleep apnea)    Allergies: No Known Allergies   Surgical History:  He  has a past surgical history that includes Colonoscopy with propofol (N/A, 03/09/2021); Esophagogastroduodenoscopy (egd) with  propofol (N/A, 03/09/2021); biopsy (03/09/2021); and Cholecystectomy (2012). Family History:  His family history includes Diabetes in his father; Hypertension in his father and mother; Other in his maternal grandfather; Thyroid disease in his maternal grandfather and mother.  REVIEW OF SYSTEMS  : All other systems reviewed and negative except where noted in the History of Present Illness.  PHYSICAL EXAM: BP 122/80 (BP Location: Left Arm, Patient Position: Sitting, Cuff Size: Normal)   Pulse 74  General Appearance: Well nourished, in no apparent distress. Head:  Normocephalic and atraumatic. Eyes:  sclerae anicteric,conjunctive pink  Respiratory: Respiratory effort normal, BS equal bilaterally without rales, rhonchi, wheezing. Cardio: RRR with no MRGs. Peripheral pulses intact.  Abdomen: Soft,  Non-distended ,active bowel sounds. mild tenderness in the LUQ. Without guarding and Without rebound. No masses. Rectal: Not evaluated Musculoskeletal: Full ROM, Normal gait. Without edema. Skin:  Dry and intact without significant lesions or rashes Neuro: Alert and  oriented x4;  No focal deficits. Psych:  Cooperative. Normal mood and affect.    Doree Albee, PA-C 3:47 PM

## 2023-05-27 NOTE — Progress Notes (Signed)
 Agree with the assessment and plan as outlined by Quentin Mulling, PA-C. ? ?Keron Neenan, DO, FACG ? ?

## 2023-05-28 ENCOUNTER — Telehealth: Payer: Self-pay

## 2023-05-28 NOTE — Telephone Encounter (Signed)
 Pharmacy Patient Advocate Encounter   Received notification from CoverMyMeds that prior authorization for Trulance 3 mg tablets is required/requested.   Insurance verification completed.   The patient is insured through [facility] .   Per test claim: PA required; PA submitted to above mentioned insurance via CoverMyMeds Key/confirmation #/EOC [facility] Status is pending

## 2023-05-30 NOTE — Telephone Encounter (Signed)
 Pharmacy Patient Advocate Encounter  Received notification from [facility] that Prior Authorization for Trulance 3 mg tablets has been APPROVED from 05-28-2023 to 05-27-2024   PA #/Case ID/Reference #: Deberah Castle

## 2023-06-07 ENCOUNTER — Ambulatory Visit: Payer: No Typology Code available for payment source | Admitting: Internal Medicine

## 2023-06-12 ENCOUNTER — Encounter: Payer: Self-pay | Admitting: Family

## 2023-06-12 ENCOUNTER — Ambulatory Visit: Admitting: Family

## 2023-06-12 VITALS — BP 116/75 | HR 81 | Temp 97.8°F | Ht 68.0 in | Wt 254.6 lb

## 2023-06-12 DIAGNOSIS — Z8639 Personal history of other endocrine, nutritional and metabolic disease: Secondary | ICD-10-CM | POA: Insufficient documentation

## 2023-06-12 DIAGNOSIS — Z1159 Encounter for screening for other viral diseases: Secondary | ICD-10-CM | POA: Diagnosis not present

## 2023-06-12 DIAGNOSIS — Z0000 Encounter for general adult medical examination without abnormal findings: Secondary | ICD-10-CM | POA: Diagnosis not present

## 2023-06-12 DIAGNOSIS — R140 Abdominal distension (gaseous): Secondary | ICD-10-CM

## 2023-06-12 DIAGNOSIS — Z114 Encounter for screening for human immunodeficiency virus [HIV]: Secondary | ICD-10-CM | POA: Diagnosis not present

## 2023-06-12 DIAGNOSIS — Z8379 Family history of other diseases of the digestive system: Secondary | ICD-10-CM | POA: Insufficient documentation

## 2023-06-12 DIAGNOSIS — H9313 Tinnitus, bilateral: Secondary | ICD-10-CM | POA: Insufficient documentation

## 2023-06-12 DIAGNOSIS — R778 Other specified abnormalities of plasma proteins: Secondary | ICD-10-CM

## 2023-06-12 DIAGNOSIS — L304 Erythema intertrigo: Secondary | ICD-10-CM

## 2023-06-12 DIAGNOSIS — F411 Generalized anxiety disorder: Secondary | ICD-10-CM

## 2023-06-12 DIAGNOSIS — Z8349 Family history of other endocrine, nutritional and metabolic diseases: Secondary | ICD-10-CM | POA: Insufficient documentation

## 2023-06-12 DIAGNOSIS — K5909 Other constipation: Secondary | ICD-10-CM

## 2023-06-12 NOTE — Progress Notes (Deleted)
 New Patient Office Visit  Subjective:  Patient ID: Chaos Carlile, male    DOB: 1985/12/23  Age: 38 y.o. MRN: [id number]  CC:  Chief Complaint  Patient presents with   New Patient (Initial Visit)   Annual Exam    Non fasting w/ labs   HPI [facility] presents for establishing care today.    Assessment & Plan:   Subjective:    Outpatient Medications Prior to Visit  Medication Sig Dispense Refill   linaclotide (LINZESS) 290 MCG CAPS capsule Take 290 mcg by mouth daily before breakfast.     propranolol ER (INDERAL LA) 60 MG 24 hr capsule Take 60 mg by mouth daily.     clonazePAM (KLONOPIN) 0.5 MG tablet Take 0.5 mg by mouth 2 (two) times daily as needed for anxiety. (Patient not taking: Reported on 06/12/2023)     Cyanocobalamin (B-12 PO) Take 1 Dose by mouth daily as needed (energy). Liquid (Patient not taking: Reported on 06/12/2023)     dicyclomine (BENTYL) 10 MG capsule Take 1 capsule (10 mg total) by mouth every 6 (six) hours as needed (abdominal cramping). (Patient not taking: Reported on 06/12/2023) 60 capsule 5   Digestive Enzyme CAPS Take 2 capsules by mouth daily. (Patient not taking: Reported on 06/12/2023)     famotidine (PEPCID) 40 MG tablet Take 1 tablet (40 mg total) by mouth daily. (Patient not taking: Reported on 06/12/2023) 90 tablet 1   ferrous sulfate 220 (44 Fe) MG/5ML solution Take 220 mg by mouth as needed. (Patient not taking: Reported on 06/12/2023)     MAGNESIUM PO Take 1 tablet by mouth at bedtime. (Patient not taking: Reported on 06/12/2023)     pantoprazole (PROTONIX) 20 MG tablet Take 1 tablet (20 mg total) by mouth daily. (Patient not taking: Reported on 06/12/2023) 30 tablet 0   Plecanatide (TRULANCE) 3 MG TABS Take 1 tablet (3 mg total) by mouth daily. (Patient not taking: Reported on 06/12/2023) 90 tablet 0   Vitamin D-Vitamin K (VITAMIN K2-VITAMIN D3 PO) Take 1 capsule by mouth daily. (Patient not taking: Reported on 06/12/2023)     No facility-administered  medications prior to visit.   Past Medical History:  Diagnosis Date   Anxiety disorder    Constipation    GERD (gastroesophageal reflux disease)    Hypertension    IBS (irritable bowel syndrome)    Obesity    OSA (obstructive sleep apnea)    Past Surgical History:  Procedure Laterality Date   BIOPSY  03/09/2021   Procedure: BIOPSY;  Surgeon: Shellia Cleverly, DO;  Location: [facility];  Service: Gastroenterology;;  EGD and COLON   CHOLECYSTECTOMY  2012   COLONOSCOPY WITH PROPOFOL N/A 03/09/2021   Procedure: COLONOSCOPY WITH PROPOFOL;  Surgeon: Shellia Cleverly, DO;  Location: [facility];  Service: Gastroenterology;  Laterality: N/A;   ESOPHAGOGASTRODUODENOSCOPY (EGD) WITH PROPOFOL N/A 03/09/2021   Procedure: ESOPHAGOGASTRODUODENOSCOPY (EGD) WITH PROPOFOL;  Surgeon: Shellia Cleverly, DO;  Location: [facility];  Service: Gastroenterology;  Laterality: N/A;    Objective:   Today's Vitals: BP 116/75 (BP Location: Left Arm, Patient Position: Sitting, Cuff Size: Large)   Pulse 81   Temp 97.8 F (36.6 C) (Temporal)   Ht 5\' 8"  (1.727 m)   Wt 254 lb 9.6 oz (115.5 kg)   SpO2 99%   BMI 38.71 kg/m   Physical Exam Vitals and nursing note reviewed.  Constitutional:      General: He is not in acute distress.  Appearance: Normal appearance.  HENT:     Head: Normocephalic.     Right Ear: Tympanic membrane and external ear normal.     Left Ear: Tympanic membrane and external ear normal.     Nose: Nose normal.     Mouth/Throat:     Mouth: Mucous membranes are moist.  Eyes:     Extraocular Movements: Extraocular movements intact.  Cardiovascular:     Rate and Rhythm: Normal rate and regular rhythm.  Pulmonary:     Effort: Pulmonary effort is normal.     Breath sounds: Normal breath sounds.  Abdominal:     General: Abdomen is flat. There is no distension.     Palpations: Abdomen is soft.     Tenderness: There is no abdominal tenderness.  Musculoskeletal:         General: Normal range of motion.     Cervical back: Normal range of motion.  Skin:    General: Skin is warm and dry.  Neurological:     Mental Status: He is alert and oriented to person, place, and time.  Psychiatric:        Mood and Affect: Mood normal.        Behavior: Behavior normal.        Judgment: Judgment normal.     No orders of the defined types were placed in this encounter.   Dulce Sellar, NP

## 2023-06-12 NOTE — Progress Notes (Signed)
 Phone: [phone]  Subjective:  Patient 38 y.o. male presenting for annual physical.  Chief Complaint  Patient presents with   New Patient (Initial Visit)   Annual Exam    Non fasting w/ labs  Discussed the use of AI scribe software for clinical note transcription with the patient, who gave verbal consent to proceed.  History of Present Illness The patient, with a history of gastrointestinal issues and thyroid concerns, presents to establish care and to get a physical. He expresses concern about an undiagnosed autoimmune disease, possibly celiac disease, as his sister was recently diagnosed with the condition. He reports daily bloating and a constant numbing pain, which he suspects might be due to an ulcer. The pain is associated with shortness of breath and intensifies with physical activity, causing discomfort. He plans to schedule an endoscopy to confirm the presence of an ulcer, having had one previously. The patient also reports tinnitus, which is constant and managed by always having some sound in the background. He has seen an ENT specialist who confirmed that his hearing and TM's  are in good condition. He also mentions chronic constipation, managed with Linzess, and a nodule in the neck area, which he believes is a thyroid nodule. The patient has a history of thyroid issues, with elevated antibodies and a family history of thyroid disease. He manages his condition with selenium, iodine, and vitamin supplements, including B12 injections due to absorption issues. He also takes propranolol for blood pressure and anxiety.  See problem oriented charting- ROS- full  review of systems was completed and negative except for: what is noted in HPI above.  The following were reviewed and entered/updated in epic: Past Medical History:  Diagnosis Date   Anxiety disorder    Constipation    GERD (gastroesophageal reflux disease)    Hypertension    IBS (irritable bowel syndrome)    Obesity     OSA (obstructive sleep apnea)    Patient Active Problem List   Diagnosis Date Noted   History of vitamin D deficiency 06/12/2023   Impaired fasting glucose 04/12/2021   Body mass index (BMI) of 50-59.9 in adult (HCC) 04/12/2021   Generalized anxiety disorder 04/12/2021   Change in bowel habits    Constipation    Rectal bleeding    Grade I internal hemorrhoids    Abdominal pain, epigastric    Gastroesophageal reflux disease without esophagitis    Duodenal nodule    Insulin resistance 11/30/2020   Tendinosis of left shoulder 11/30/2020   Chest pain of uncertain etiology 04/04/2020   DOE (dyspnea on exertion) 04/04/2020   Essential hypertension 04/04/2020   Morbid obesity (HCC) 04/04/2020   Gingivitis 01/13/2020   Gastroesophageal reflux disease 01/13/2020   Hyperlipidemia 12/31/2019   Tobacco abuse 12/31/2019   Mixed anxiety and depressive disorder 06/03/2019   Anxiety 11/21/2018   Intertrigo 10/01/2017   Neuropathy 10/01/2017   Hypertension 03/08/2017   Past Surgical History:  Procedure Laterality Date   BIOPSY  03/09/2021   Procedure: BIOPSY;  Surgeon: Shellia Cleverly, DO;  Location: [facility];  Service: Gastroenterology;;  EGD and COLON   CHOLECYSTECTOMY  2012   COLONOSCOPY WITH PROPOFOL N/A 03/09/2021   Procedure: COLONOSCOPY WITH PROPOFOL;  Surgeon: Shellia Cleverly, DO;  Location: [facility];  Service: Gastroenterology;  Laterality: N/A;   ESOPHAGOGASTRODUODENOSCOPY (EGD) WITH PROPOFOL N/A 03/09/2021   Procedure: ESOPHAGOGASTRODUODENOSCOPY (EGD) WITH PROPOFOL;  Surgeon: Shellia Cleverly, DO;  Location: [facility];  Service: Gastroenterology;  Laterality: N/A;  Family History  Problem Relation Age of Onset   Thyroid disease Mother    Hypertension Mother    Diabetes Father    Hypertension Father    Thyroid disease Maternal Grandfather    Other Maternal Grandfather        malignant tumor of the lung    Medications- reviewed and updated Current  Outpatient Medications  Medication Sig Dispense Refill   linaclotide (LINZESS) 290 MCG CAPS capsule Take 290 mcg by mouth daily before breakfast.     propranolol ER (INDERAL LA) 60 MG 24 hr capsule Take 60 mg by mouth daily.     No current facility-administered medications for this visit.    Allergies-reviewed and updated No Known Allergies  Social History   Social History Narrative   Are you right handed or left handed? right   Are you currently employed ? 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    What type of home do you live in: 1 story or 2 story? one    Caffeine 1 cup daily    Objective:  BP 116/75 (BP Location: Left Arm, Patient Position: Sitting, Cuff Size: Large)   Pulse 81   Temp 97.8 F (36.6 C) (Temporal)   Ht 5\' 8"  (1.727 m)   Wt 254 lb 9.6 oz (115.5 kg)   SpO2 99%   BMI 38.71 kg/m  Physical Exam Vitals and nursing note reviewed.  Constitutional:      General: He is not in acute distress.    Appearance: Normal appearance. He is obese.  HENT:     Head: Normocephalic.     Right Ear: Tympanic membrane and external ear normal.     Left Ear: Tympanic membrane and external ear normal.     Nose: Nose normal.     Mouth/Throat:     Mouth: Mucous membranes are moist.  Eyes:     Extraocular Movements: Extraocular movements intact.  Cardiovascular:     Rate and Rhythm: Normal rate and regular rhythm.  Pulmonary:     Effort: Pulmonary effort is normal.     Breath sounds: Normal breath sounds.  Abdominal:     General: Abdomen is flat. There is no distension.     Palpations: Abdomen is soft.     Tenderness: There is no abdominal tenderness.  Musculoskeletal:        General: Normal range of motion.     Cervical back: Normal range of motion.  Skin:    General: Skin is warm and dry.  Neurological:     Mental Status: He is alert and oriented to person, place, and time.  Psychiatric:         Mood and Affect: Mood normal.        Behavior: Behavior normal.        Judgment: Judgment normal.      Assessment and Plan   Health Maintenance counseling: 1. Anticipatory guidance: Patient counseled regarding regular dental exams q6 months, eye exams yearly, avoiding smoking and second hand smoke, limiting alcohol to 2 beverages per day.   2. Risk factor reduction:  Advised patient of need for regular exercise and diet rich in fruits and vegetables to reduce risk of heart attack and stroke.    Wt Readings from Last 3 Encounters:  06/12/23 254 lb 9.6 oz (115.5 kg)  05/23/23 250 lb (113.4 kg)  11/14/22 235 lb (106.6 kg)  3. Immunizations/screenings/ancillary studies  There is no immunization history on file for this patient. Health Maintenance Due  Topic Date Due   HIV Screening  Never done    4. Skin cancer screening-  advised regular sunscreen use. Denies worrisome, changing, or new skin lesions.  5. Smoking associated screening:  ex-smoker, quit 2017 6. STD screening - N/A 7. Alcohol screening: none  Assessment & Plan Possible Autoimmune Disorder - Symptoms and family history suggest autoimmune disorder. Elevated thyroid antibodies and low platelets noted. - Order comprehensive blood work: full metabolic panel, blood count, thyroid function tests, HIV and hepatitis C screening, ANA for autoimmune panel. - Refer to endocrinology for thyroid and autoimmune evaluation.  Chronic Constipation - Chronic constipation with intermittent relief from Linzess. Takes magnesium citrate if Linzess not working. - Continue Linzess 290 mg qd, notify office for refill when needed if not refilled by GI. - Use magnesium citrate as needed. - Consider magnesium oxide as daily supplement. - F/U in 6 mos unless seen by GI  Anxiety - Anxiety and heart rate well-controlled on propranolol. Denies SE. - Continue propranolol 60 mg extended release daily, call office when refill needed. - Follow up  in 3-6 months.  Tinnitus - Chronic tinnitus- seen by ENT, hearing normal, no TM damage, unsure etiology, stress?  Managed with sound therapy. - Continue sound therapy.  Vitamin deficiencies - Monitors health with blood work. B12 injections for malabsorption. Regular vitamin D and B12 supplementation. - Order blood work: vitamin D and B12 levels. - Continue B12 injections monthly and daily oral Vitamin D3.  Thyroid issues -  Plans to return for lab work. Prefers three-month follow-up due to symptom variability. - Schedule follow-up in three months. - Follow up with endocrinology. - Return for lab work.  Annual physical exam - -     CBC with Differential/Platelet; Future -     Comprehensive metabolic panel with GFR; Future -     Lipid panel; Future -     TSH; Future   Recommended follow up: Return in about 3 months (around 09/11/2023) for med refills. Future Appointments  Date Time Provider Department Center  06/13/2023 10:00 AM [facility]  09/11/2023  8:00 AM Dulce Sellar, NP [facility]   Lab/Order associations:  non-fasting   Dulce Sellar, NP

## 2023-06-12 NOTE — Assessment & Plan Note (Signed)
 Chronic tinnitus - seen by ENT, hearing normal, no TM damage, unsure etiology, stress? Managed with sound therapy. - Continue sound therapy.

## 2023-06-12 NOTE — Assessment & Plan Note (Signed)
 Anxiety and heart rate well-controlled on propranolol. Denies SE. - Continue propranolol 60 mg extended release daily, call office when refill needed. - Follow up in 3-6 months.

## 2023-06-12 NOTE — Assessment & Plan Note (Signed)
 Plans to return for lab work. Prefers three-month follow-up due to symptom variability. - Schedule follow-up in three months. - Follow up with endocrinology. - Return for lab work.

## 2023-06-12 NOTE — Patient Instructions (Signed)
 Welcome to [facility], It was a pleasure meeting you today!    I will review your lab results via MyChart in a few days.  I have sent a referral to Endocrinology.  Please schedule a 3 month follow up visit today.    PLEASE NOTE: If you had any LAB tests please let us know if you have not heard back within a few days. You may see your results on MyChart before we have a chance to review them but we will give you a call once they are reviewed by [place]. If we ordered any REFERRALS today, please let us know if you have not heard from their office within the next week.  Let us know through MyChart if you are needing REFILLS, or have your pharmacy send [place] the request. You can also use MyChart to communicate with me or any office staff.  Please try these tips to maintain a healthy lifestyle: It is important that you exercise regularly at least 30 minutes 5 times a week. Think about what you will eat, plan ahead. Choose whole foods, & think  "clean, green, fresh or frozen" over canned, processed or packaged foods which are more sugary, salty, and fatty. 70 to 75% of food eaten should be fresh vegetables and protein. 2-3  meals daily with healthy snacks between meals, but must be whole fruit, protein or vegetables. Aim to eat over a 10 hour period when you are active, for example, 7am to 5pm, and then STOP after your last meal of the day, drinking only water.  Shorter eating windows, 6-8 hours, are showing benefits in heart disease and blood sugar regulation. Drink water every day! Shoot for 64 ounces daily = 8 cups, no other drink is as healthy! Fruit juice is best enjoyed in a healthy way, by EATING the fruit.

## 2023-06-12 NOTE — Assessment & Plan Note (Signed)
 Chronic constipation with intermittent relief from Linzess. Takes magnesium citrate if Linzess not working. - Continue Linzess 290 mg qd, notify office for refill when needed if not refilled by GI. - Use magnesium citrate as needed. - Consider magnesium oxide as daily supplement. - F/U in 6 mos unless seen by GI

## 2023-06-13 ENCOUNTER — Other Ambulatory Visit

## 2023-06-14 ENCOUNTER — Other Ambulatory Visit (INDEPENDENT_AMBULATORY_CARE_PROVIDER_SITE_OTHER)

## 2023-06-14 DIAGNOSIS — Z8639 Personal history of other endocrine, nutritional and metabolic disease: Secondary | ICD-10-CM

## 2023-06-14 DIAGNOSIS — R768 Other specified abnormal immunological findings in serum: Secondary | ICD-10-CM

## 2023-06-14 DIAGNOSIS — Z1321 Encounter for screening for nutritional disorder: Secondary | ICD-10-CM

## 2023-06-14 DIAGNOSIS — R778 Other specified abnormalities of plasma proteins: Secondary | ICD-10-CM

## 2023-06-14 DIAGNOSIS — R140 Abdominal distension (gaseous): Secondary | ICD-10-CM

## 2023-06-14 DIAGNOSIS — Z0000 Encounter for general adult medical examination without abnormal findings: Secondary | ICD-10-CM

## 2023-06-14 DIAGNOSIS — Z1159 Encounter for screening for other viral diseases: Secondary | ICD-10-CM

## 2023-06-14 DIAGNOSIS — Z8349 Family history of other endocrine, nutritional and metabolic diseases: Secondary | ICD-10-CM | POA: Diagnosis not present

## 2023-06-14 DIAGNOSIS — Z114 Encounter for screening for human immunodeficiency virus [HIV]: Secondary | ICD-10-CM

## 2023-06-14 DIAGNOSIS — Z8379 Family history of other diseases of the digestive system: Secondary | ICD-10-CM

## 2023-06-14 LAB — CBC WITH DIFFERENTIAL/PLATELET
Basophils Absolute: 0 10*3/uL (ref 0.0–0.1)
Basophils Relative: 0.4 % (ref 0.0–3.0)
Eosinophils Absolute: 0.2 10*3/uL (ref 0.0–0.7)
Eosinophils Relative: 3 % (ref 0.0–5.0)
HCT: 43.8 % (ref 39.0–52.0)
Hemoglobin: 15 g/dL (ref 13.0–17.0)
Lymphocytes Relative: 23.8 % (ref 12.0–46.0)
Lymphs Abs: 1.5 10*3/uL (ref 0.7–4.0)
MCHC: 34.2 g/dL (ref 30.0–36.0)
MCV: 90.4 fl (ref 78.0–100.0)
Monocytes Absolute: 0.4 10*3/uL (ref 0.1–1.0)
Monocytes Relative: 5.6 % (ref 3.0–12.0)
Neutro Abs: 4.2 10*3/uL (ref 1.4–7.7)
Neutrophils Relative %: 67.2 % (ref 43.0–77.0)
Platelets: 160 10*3/uL (ref 150.0–400.0)
RBC: 4.85 Mil/uL (ref 4.22–5.81)
RDW: 12.8 % (ref 11.5–15.5)
WBC: 6.3 10*3/uL (ref 4.0–10.5)

## 2023-06-14 LAB — COMPREHENSIVE METABOLIC PANEL WITH GFR
ALT: 19 U/L (ref 0–53)
AST: 25 U/L (ref 0–37)
Albumin: 4.3 g/dL (ref 3.5–5.2)
Alkaline Phosphatase: 37 U/L — ABNORMAL LOW (ref 39–117)
BUN: 19 mg/dL (ref 6–23)
CO2: 26 meq/L (ref 19–32)
Calcium: 9.3 mg/dL (ref 8.4–10.5)
Chloride: 102 meq/L (ref 96–112)
Creatinine, Ser: 0.91 mg/dL (ref 0.40–1.50)
GFR: 109.04 mL/min (ref >60.00)
Glucose, Bld: 81 mg/dL (ref 70–99)
Potassium: 4.1 meq/L (ref 3.5–5.1)
Sodium: 137 meq/L (ref 135–145)
Total Bilirubin: 0.9 mg/dL (ref 0.2–1.2)
Total Protein: 7.3 g/dL (ref 6.0–8.3)

## 2023-06-14 LAB — LIPID PANEL
Cholesterol: 166 mg/dL (ref 0–200)
HDL: 51.1 mg/dL (ref >39.00)
LDL Cholesterol: 104 mg/dL — ABNORMAL HIGH (ref 0–99)
NonHDL: 115
Total CHOL/HDL Ratio: 3
Triglycerides: 53 mg/dL (ref 0.0–149.0)
VLDL: 10.6 mg/dL (ref 0.0–40.0)

## 2023-06-14 LAB — VITAMIN D 25 HYDROXY (VIT D DEFICIENCY, FRACTURES): VITD: 89.05 ng/mL (ref 30.00–100.00)

## 2023-06-14 LAB — T4, FREE: Free T4: 0.9 ng/dL (ref 0.60–1.60)

## 2023-06-14 LAB — VITAMIN B12: Vitamin B-12: 692 pg/mL (ref 211–911)

## 2023-06-14 LAB — TSH: TSH: 1.56 u[IU]/mL (ref 0.35–5.50)

## 2023-06-18 ENCOUNTER — Encounter: Payer: Self-pay | Admitting: Family

## 2023-06-19 LAB — TISSUE TRANSGLUTAMINASE, IGA: (tTG) Ab, IgA: <1 U/mL

## 2023-06-19 LAB — ENDOMYSIAL AB IGA RFLX TITER: Endomysial Ab IgA: NEGATIVE

## 2023-06-19 LAB — HIV ANTIBODY (ROUTINE TESTING W REFLEX): HIV 1&2 Ab, 4th Generation: NONREACTIVE

## 2023-06-19 LAB — HEPATITIS C ANTIBODY: Hepatitis C Ab: NONREACTIVE

## 2023-06-19 LAB — GLIADIN ANTIBODIES, SERUM
Gliadin IgA: 2.4 U/mL
Gliadin IgG: 1.9 U/mL

## 2023-06-21 LAB — ENA+DNA/DS+ANTICH+CENTRO+FA...
Anti JO-1: <0.2 AI (ref 0.0–0.9)
Antiribosomal P Antibodies: 0.2 AI (ref 0.0–0.9)
Centromere Ab Screen: <0.2 AI (ref 0.0–0.9)
Chromatin Ab SerPl-aCnc: <0.2 AI (ref 0.0–0.9)
ENA RNP Ab: <0.2 AI (ref 0.0–0.9)
ENA SM Ab Ser-aCnc: <0.2 AI (ref 0.0–0.9)
ENA SSA (RO) Ab: <0.2 AI (ref 0.0–0.9)
ENA SSB (LA) Ab: <0.2 AI (ref 0.0–0.9)
Nucleolar Pattern: 1:320 {titer} — ABNORMAL HIGH
Scleroderma (Scl-70) (ENA) Antibody, IgG: <0.2 AI (ref 0.0–0.9)
Smith/RNP Antibodies: <0.2 AI (ref 0.0–0.9)
dsDNA Ab: 1 [IU]/mL (ref 0–9)

## 2023-06-21 LAB — ANA W/REFLEX: ANA Titer 1: POSITIVE — AB

## 2023-06-24 ENCOUNTER — Other Ambulatory Visit: Payer: Self-pay

## 2023-06-24 ENCOUNTER — Encounter (HOSPITAL_COMMUNITY): Payer: Self-pay | Admitting: Emergency Medicine

## 2023-06-24 ENCOUNTER — Emergency Department (HOSPITAL_COMMUNITY)
Admission: EM | Admit: 2023-06-24 | Discharge: 2023-06-24 | Discharge status: Against Medical Advice | Attending: Emergency Medicine | Admitting: Emergency Medicine

## 2023-06-24 DIAGNOSIS — R112 Nausea with vomiting, unspecified: Secondary | ICD-10-CM | POA: Diagnosis present

## 2023-06-24 DIAGNOSIS — Z5321 Procedure and treatment not carried out due to patient leaving prior to being seen by health care provider: Secondary | ICD-10-CM | POA: Diagnosis not present

## 2023-06-24 DIAGNOSIS — R109 Unspecified abdominal pain: Secondary | ICD-10-CM | POA: Insufficient documentation

## 2023-06-24 DIAGNOSIS — R1110 Vomiting, unspecified: Secondary | ICD-10-CM | POA: Diagnosis not present

## 2023-06-24 DIAGNOSIS — R55 Syncope and collapse: Secondary | ICD-10-CM | POA: Diagnosis not present

## 2023-06-24 LAB — COMPREHENSIVE METABOLIC PANEL WITH GFR
ALT: 26 U/L (ref 0–44)
AST: 31 U/L (ref 15–41)
Albumin: 4 g/dL (ref 3.5–5.0)
Alkaline Phosphatase: 34 U/L — ABNORMAL LOW (ref 38–126)
Anion gap: 11 (ref 5–15)
BUN: 21 mg/dL — ABNORMAL HIGH (ref 6–20)
CO2: 24 mmol/L (ref 22–32)
Calcium: 9.1 mg/dL (ref 8.9–10.3)
Chloride: 104 mmol/L (ref 98–111)
Creatinine, Ser: 0.96 mg/dL (ref 0.61–1.24)
GFR, Estimated: >60 mL/min (ref >60)
Glucose, Bld: 144 mg/dL — ABNORMAL HIGH (ref 70–99)
Potassium: 4 mmol/L (ref 3.5–5.1)
Sodium: 139 mmol/L (ref 135–145)
Total Bilirubin: 1.6 mg/dL — ABNORMAL HIGH (ref 0.0–1.2)
Total Protein: 7.1 g/dL (ref 6.5–8.1)

## 2023-06-24 LAB — URINALYSIS, ROUTINE W REFLEX MICROSCOPIC
Bacteria, UA: NONE SEEN
Bilirubin Urine: NEGATIVE
Glucose, UA: NEGATIVE mg/dL
Hgb urine dipstick: NEGATIVE
Ketones, ur: 5 mg/dL — AB
Leukocytes,Ua: NEGATIVE
Nitrite: NEGATIVE
Protein, ur: 100 mg/dL — AB
Specific Gravity, Urine: 1.026 (ref 1.005–1.030)
pH: 5 (ref 5.0–8.0)

## 2023-06-24 LAB — CBC
HCT: 46.2 % (ref 39.0–52.0)
Hemoglobin: 15.6 g/dL (ref 13.0–17.0)
MCH: 30.3 pg (ref 26.0–34.0)
MCHC: 33.8 g/dL (ref 30.0–36.0)
MCV: 89.7 fL (ref 80.0–100.0)
Platelets: 152 10*3/uL (ref 150–400)
RBC: 5.15 MIL/uL (ref 4.22–5.81)
RDW: 12.5 % (ref 11.5–15.5)
WBC: 7.9 10*3/uL (ref 4.0–10.5)
nRBC: 0 % (ref 0.0–0.2)

## 2023-06-24 LAB — LIPASE, BLOOD: Lipase: 29 U/L (ref 11–51)

## 2023-06-24 NOTE — ED Triage Notes (Signed)
 Patient c/o n/v and abdominal pain that started this afternoon, family with similar symptoms.  Patient also reports having a syncopal episode while vomiting and hitting his head.

## 2023-06-24 NOTE — ED Notes (Signed)
 Pt did not want any longer will go to urgent care in the am.

## 2023-07-18 ENCOUNTER — Ambulatory Visit: Admitting: Physician Assistant

## 2023-07-19 ENCOUNTER — Ambulatory Visit: Admitting: Family

## 2023-09-11 ENCOUNTER — Encounter: Payer: Self-pay | Admitting: Family

## 2023-09-11 ENCOUNTER — Ambulatory Visit: Admitting: Family

## 2023-09-11 VITALS — BP 118/74 | HR 56 | Temp 97.4°F | Ht 68.0 in | Wt 255.8 lb

## 2023-09-11 DIAGNOSIS — R768 Other specified abnormal immunological findings in serum: Secondary | ICD-10-CM | POA: Diagnosis not present

## 2023-09-11 DIAGNOSIS — R778 Other specified abnormalities of plasma proteins: Secondary | ICD-10-CM | POA: Insufficient documentation

## 2023-09-11 DIAGNOSIS — E669 Obesity, unspecified: Secondary | ICD-10-CM | POA: Diagnosis not present

## 2023-09-11 DIAGNOSIS — K5909 Other constipation: Secondary | ICD-10-CM | POA: Diagnosis not present

## 2023-09-11 NOTE — Patient Instructions (Addendum)
 It was very nice to see you today!   For weight loss you can look up Qsymia or Contrave   Call [facility] Rheumatology office at [phone], [address], [place]. I also referred you to Endocrinology and they were unable to reach you. [phone], [address] This was for your elevated serum thyroglobulin that could indicate a problem with the thyroid  even though your hormones are in normal range.        PLEASE NOTE:  If you had any lab tests please let us  know if you have not heard back within a few days. You may see your results on MyChart before we have a chance to review them but we will give you a call once they are reviewed by us . If we ordered any referrals today, please let us  know if you have not heard from their office within the next week.

## 2023-09-11 NOTE — Progress Notes (Signed)
 Patient ID: Eddie Murray, male    DOB: 06/29/1985, 38 y.o.   MRN: [id number]  Chief Complaint  Patient presents with   Follow-up    Patient asking for same blood work as last time.   Discussed the use of AI scribe software for clinical note transcription with the patient, who gave verbal consent to proceed.  History of Present Illness Eddie Murray is a 38 year old male who presents for follow-up on abnormal lab results and referral to rheumatology.  Episodic systemic symptoms - Episodic flare-ups over the past six years impacting quality of life - Symptoms during episodes include feeling unwell, fatigue, and weakness - Abnormal antinuclear antibody (ANA) levels - Has not yet seen a rheumatologist  Thrombocytopenia - Platelet count fluctuations with a significant drop to 50,000 in September of last year - Platelet count normalized to 140,000 within one week of the drop - Platelet fluctuations coincide with episodes of fatigue and weakness  Gastrointestinal dysfunction - Chronic constipation, stomach discomfort - Linzess  was ineffective and caused diarrhea - Magnesium supplements and colonic treatments have improved bowel regularity - Uses omeprazole during flare-ups for acid reflux  Weight management difficulties - Difficulty losing weight despite regular exercise and a clean diet - Attributes weight management challenges to underlying autoimmune condition  Current medications and supplements - Takes propranolol - Receives B12 injections biweekly - Takes vitamins D, C, and B12  Assessment & Plan 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 Differential includes lupus, Sjogren's, and other connective tissue disorders. Rheumatology evaluation required. - Confirm rheumatology appointment on September 16th with Dr. Lonni Ester. - Advise him to call rheumatology office for callback list for earlier appointments. - Ensure follow-up  with rheumatology for comprehensive autoimmune panel.  Idiopathic Thrombocytopenia Episodes of low platelet counts consistent with idiopathic thrombocytopenia. Monitoring required. Hematology referral if autoimmune workup inconclusive. - Monitor platelet levels regularly. - Consider referral to hematology if autoimmune workup is inconclusive.  Chronic Constipation Chronic constipation unresponsive to Linzess  or magnesium oxide. Relief with colonics. Bloating and gas persist. - Continue monthly colonics as needed. - Consider alternative treatments if symptoms persist.  Weight Management Difficulty with weight loss despite exercise and diet. Interested in pharmacological options, but GLP-1s & Phentermine contraindicated d/t GI SE. Discussed Qsymia and Contrave. - Discussed weight loss medication options including Qsymia and Contrave, advised to research online. - Advise him to research medications and consider mail order programs. - Advised to consider referral to [facility]  Follow-up Upcoming rheumatology and endocrinology appointments for autoimmune disorder and thyroid  evaluation. - Ensure follow-up with endocrinology for thyroid  evaluation. - Provide contact information for rheumatology and endocrinology offices.   Subjective:    Outpatient Medications Prior to Visit  Medication Sig Dispense Refill   clonazePAM (KLONOPIN) 0.5 MG tablet Take 0.5 mg by mouth 2 (two) times daily.     propranolol ER (INDERAL LA) 60 MG 24 hr capsule Take 60 mg by mouth daily.     linaclotide  (LINZESS ) 290 MCG CAPS capsule Take 290 mcg by mouth daily before breakfast. (Patient not taking: Reported on 09/11/2023)     No facility-administered medications prior to visit.   Past Medical History:  Diagnosis Date   Abdominal pain, epigastric    Anxiety 11/21/2018   Anxiety disorder    Body mass index (BMI) of 50-59.9 in adult [facility]) 04/12/2021   Change in bowel habits     Chest pain of uncertain etiology 04/04/2020  Constipation    DOE (dyspnea on exertion) 04/04/2020   Gastroesophageal reflux disease 01/13/2020   GERD (gastroesophageal reflux disease)    Gingivitis 01/13/2020   Hypertension    IBS (irritable bowel syndrome)    Impaired fasting glucose 04/12/2021   Insulin resistance 11/30/2020   Intertrigo 10/01/2017   Left sided lateral abdominal maceration with induration noted between skin folds.  No fluctuance.  Will prescribe nystatin cream and given written information for supportive care.        Mixed anxiety and depressive disorder 06/03/2019   Morbid obesity (HCC) 04/04/2020   Obesity    OSA (obstructive sleep apnea)    Rectal bleeding    Tobacco abuse 12/31/2019   Past Surgical History:  Procedure Laterality Date   BIOPSY  03/09/2021   Procedure: BIOPSY;  Surgeon: San Sandor GAILS, DO;  Location: [facility];  Service: Gastroenterology;;  EGD and COLON   CHOLECYSTECTOMY  2012   COLONOSCOPY WITH PROPOFOL  N/A 03/09/2021   Procedure: COLONOSCOPY WITH PROPOFOL ;  Surgeon: San Sandor GAILS, DO;  Location: [facility];  Service: Gastroenterology;  Laterality: N/A;   ESOPHAGOGASTRODUODENOSCOPY (EGD) WITH PROPOFOL  N/A 03/09/2021   Procedure: ESOPHAGOGASTRODUODENOSCOPY (EGD) WITH PROPOFOL ;  Surgeon: San Sandor GAILS, DO;  Location: [facility];  Service: Gastroenterology;  Laterality: N/A;   No Known Allergies    Objective:    Physical Exam Vitals and nursing note reviewed.  Constitutional:      General: He is not in acute distress.    Appearance: Normal appearance. He is obese.  HENT:     Head: Normocephalic.  Cardiovascular:     Rate and Rhythm: Normal rate and regular rhythm.  Pulmonary:     Effort: Pulmonary effort is normal.     Breath sounds: Normal breath sounds.  Musculoskeletal:        General: Normal range of motion.     Cervical back: Normal range of motion.  Skin:    General: Skin is warm and dry.   Neurological:     Mental Status: He is alert and oriented to person, place, and time.  Psychiatric:        Mood and Affect: Mood normal.    BP 118/74   Pulse (!) 56   Temp (!) 97.4 F (36.3 C) (Temporal)   Ht 5' 8 (1.727 m)   Wt 255 lb 12.8 oz (116 kg)   SpO2 97%   BMI 38.89 kg/m  Wt Readings from Last 3 Encounters:  09/11/23 255 lb 12.8 oz (116 kg)  06/24/23 264 lb 8.8 oz (120 kg)  06/12/23 254 lb 9.6 oz (115.5 kg)       Lucius Krabbe, NP

## 2023-11-02 ENCOUNTER — Ambulatory Visit: Admission: EM | Admit: 2023-11-02 | Discharge: 2023-11-02 | Disposition: A | Discharge status: Home / Self Care

## 2023-11-02 DIAGNOSIS — G8929 Other chronic pain: Secondary | ICD-10-CM

## 2023-11-02 DIAGNOSIS — H9313 Tinnitus, bilateral: Secondary | ICD-10-CM | POA: Diagnosis not present

## 2023-11-02 DIAGNOSIS — R109 Unspecified abdominal pain: Secondary | ICD-10-CM

## 2023-11-02 DIAGNOSIS — R519 Headache, unspecified: Secondary | ICD-10-CM

## 2023-11-02 NOTE — ED Provider Notes (Signed)
 [facility]    CSN: [id number] Arrival date & time: 11/02/23  1053      History   Chief Complaint Chief Complaint  Patient presents with   Abdominal Pain   Otalgia    HPI Eddie Murray is a 38 y.o. male.  Patient reports he is here to have his vitals checked.  He wants to make sure he is healthy currently. He has had a a lot of chronic issues for the last 6 years.  Reports everything started in 2019.  He has been having on and off headaches, abdominal pain, and ringing in the ears. Reports he has seen GI and has tried all of the medicines and has had all of the tests. He is going to see a rheumatologist in October.  He wanted to make sure he could wait until October to see somebody.   He has used advil and aleve for symptoms more recently    Past Medical History:  Diagnosis Date   Abdominal pain, epigastric    Anxiety 11/21/2018   Anxiety disorder    Body mass index (BMI) of 50-59.9 in adult (HCC) 04/12/2021   Change in bowel habits    Chest pain of uncertain etiology 04/04/2020   Constipation    DOE (dyspnea on exertion) 04/04/2020   Gastroesophageal reflux disease 01/13/2020   GERD (gastroesophageal reflux disease)    Gingivitis 01/13/2020   Hypertension    IBS (irritable bowel syndrome)    Impaired fasting glucose 04/12/2021   Insulin resistance 11/30/2020   Intertrigo 10/01/2017   Left sided lateral abdominal maceration with induration noted between skin folds.  No fluctuance.  Will prescribe nystatin cream and given written information for supportive care.        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   History of vitamin D   deficiency 06/12/2023   Family history of celiac disease 06/12/2023   Family history of thyroid  disease 06/12/2023   History of non anemic vitamin B12 deficiency 06/12/2023   Tinnitus of both ears 06/12/2023   Generalized anxiety disorder 04/12/2021   Constipation    Grade I internal hemorrhoids    Gastroesophageal reflux disease without esophagitis    Duodenal nodule    Tendinosis of left shoulder 11/30/2020   Essential hypertension 04/04/2020   Hyperlipidemia 12/31/2019   Neuropathy 10/01/2017    Past Surgical History:  Procedure Laterality Date   BIOPSY  03/09/2021   Procedure: BIOPSY;  Surgeon: San Sandor GAILS, DO;  Location: [facility];  Service: Gastroenterology;;  EGD and COLON   CHOLECYSTECTOMY  2012   COLONOSCOPY WITH PROPOFOL  N/A 03/09/2021   Procedure: COLONOSCOPY WITH PROPOFOL ;  Surgeon: San Sandor GAILS, DO;  Location: [facility];  Service: Gastroenterology;  Laterality: N/A;   ESOPHAGOGASTRODUODENOSCOPY (EGD) WITH PROPOFOL  N/A 03/09/2021   Procedure: ESOPHAGOGASTRODUODENOSCOPY (EGD) WITH PROPOFOL ;  Surgeon: San Sandor GAILS, DO;  Location: [facility];  Service: Gastroenterology;  Laterality: N/A;       Home Medications    Prior to Admission medications   Medication Sig Start Date End Date Taking? Authorizing Provider  clonazePAM (KLONOPIN) 0.5 MG tablet Take 0.5 mg by mouth 2 (two) times daily. 08/28/23  [provider]  propranolol ER (INDERAL LA) 60 MG 24 hr capsule Take 60 mg by mouth daily.    [provider]    Family History Family History  Problem Relation Age of Onset   Thyroid  disease Mother    Hypertension Mother    Diabetes Father    Hypertension Father    Thyroid  disease Maternal Grandfather    Other Maternal Grandfather        malignant tumor of the lung    Social History Social History   Tobacco Use   Smoking status: Former    Current packs/day: 0.00    Types: Cigarettes, Cigars    Quit date: 2017     Years since quitting: 8.6   Smokeless tobacco: Never  Vaping Use   Vaping status: Never Used  Substance Use Topics   Alcohol use: Never   Drug use: Never     Allergies   Patient has no known allergies.   Review of Systems Review of Systems   Physical Exam Triage Vital Signs ED Triage Vitals [11/02/23 1135]  Encounter Vitals Group     BP (!) 141/84     Girls Systolic BP Percentile      Girls Diastolic BP Percentile      Boys Systolic BP Percentile      Boys Diastolic BP Percentile      Pulse Rate (!) 56     Resp 17     Temp 98.5 F (36.9 C)     Temp Source Oral     SpO2 98 %     Weight      Height      Head Circumference      Peak Flow      Pain Score 7     Pain Loc      Pain Education      Exclude from Growth Chart    No data found.  Updated Vital Signs BP 114/79   Pulse 60   Temp 98.5 F (36.9 C) (Oral)   Resp 17   SpO2 98%   Visual Acuity Right Eye Distance:   Left Eye Distance:   Bilateral Distance:    Right Eye Near:   Left Eye Near:    Bilateral Near:     Physical Exam Vitals and nursing note reviewed.  Constitutional:      General: He is not in acute distress.    Appearance: Normal appearance. He is not ill-appearing.  HENT:     Right Ear: Tympanic membrane, ear canal and external ear normal.     Left Ear: Tympanic membrane, ear canal and external ear normal.     Mouth/Throat:     Mouth: Mucous membranes are moist.     Pharynx: Oropharynx is clear.  Eyes:     Conjunctiva/sclera: Conjunctivae normal.     Pupils: Pupils are equal, round, and reactive to light.  Cardiovascular:     Rate and Rhythm: Normal rate and regular rhythm.     Heart sounds: Normal heart sounds.  Pulmonary:     Effort: Pulmonary effort is normal. No respiratory distress.     Breath sounds: Normal breath sounds.  Abdominal:     General: Bowel sounds are normal.     Palpations: Abdomen is soft.     Tenderness: There is no abdominal tenderness. There is no  right CVA tenderness, left CVA tenderness, guarding or rebound.  Musculoskeletal:        General: Normal range of motion.  Cervical back: Normal range of motion. No rigidity.  Skin:    General: Skin is warm and dry.  Neurological:     General: No focal deficit present.     Mental Status: He is alert and oriented to person, place, and time.     Cranial Nerves: No cranial nerve deficit.     Sensory: No sensory deficit.     Motor: No weakness.     Coordination: Coordination normal.     Gait: Gait normal.      UC Treatments / Results  Labs (all labs ordered are listed, but only abnormal results are displayed) Labs Reviewed - No data to display  EKG   Radiology No results found.  Procedures Procedures (including critical care time)  Medications Ordered in UC Medications - No data to display  Initial Impression / Assessment and Plan / UC Course  I have reviewed the triage vital signs and the nursing notes.  Pertinent labs & imaging results that were available during my care of the patient were reviewed by me and considered in my medical decision making (see chart for details).  Vitals stable today Neurologically intact Chronic abdominal pain, headache, and tinnitus for the last 6 years Advised of ototoxicity of NSAIDs; recommend avoid He is set to see rheumatology in 2 months. Discussion with patient about medication options, he declines anything at this time given that he has tried everything in the past.  We have discussed strict emergency department precautions for any new symptoms, or worsening of current symptoms.  Patient verbalizes understanding  Final Clinical Impressions(s) / UC Diagnoses   Final diagnoses:  Tinnitus, bilateral  Chronic abdominal pain  Chronic nonintractable headache, unspecified headache type     Discharge Instructions      NSAIDs like ibuprofen/Advil, naproxen/Aleve, meloxicam/Mobic can have some effect on tinnitus and make it worse.  They can also cause worsening abdominal pain. I recommend to stop these entirely. Use tylenol  instead  Please go to the emergency department if symptoms worsen or become severe    ED Prescriptions   None    PDMP not reviewed this encounter.   Jeryl Stabs, PA-C 11/02/23 1419

## 2023-11-02 NOTE — Discharge Instructions (Addendum)
 NSAIDs like ibuprofen/Advil, naproxen/Aleve, meloxicam/Mobic can have some effect on tinnitus and make it worse. They can also cause worsening abdominal pain. I recommend to stop these entirely. Use tylenol  instead  Please go to the emergency department if symptoms worsen or become severe

## 2023-11-02 NOTE — ED Triage Notes (Signed)
 Patient states that he's had his sx for years. Patient states that he does have a PCP. Patient states that he was sent to a rheumatologist. Patient states that PCP told him that he has an autoimmune disorder. But hasn't been confirmed. Patient thinks he has lupas from blood work.   Patient states that his stomach is always bloated whether he eats or not. Abdominal pain 7/10. Patient states that when he eats abdominal pain is worse.  Muffled hearing in left ear x 1 week.

## 2023-11-22 IMAGING — US US THYROID
2 series · 14 of 25 positions shown · non-contrast
Comparison: CT head, 09/23/2017

CLINICAL DATA: Other. Sick-euthyroid syndrome. EUTHYROID WITH
THYROID ANTIBODIES. FAMILY HX OF THYROID DISEASE, PT WITH NEWLY
DIAGNOSED POSITIVE THYROID ANTIBODIES. CONCERN FOR POSSIBLE NODULE

EXAM:
THYROID ULTRASOUND
TECHNIQUE: Ultrasound examination of the thyroid gland and adjacent soft
tissues was performed.

[Series 1: us thyroid · 0.08mm/px · 13 of 46 slices shown (1 of 2)]
[im 1/46]
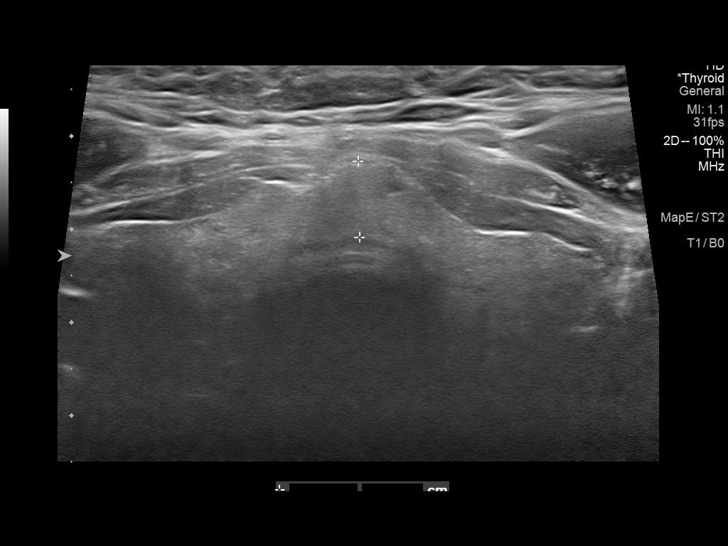
[im 4/46]
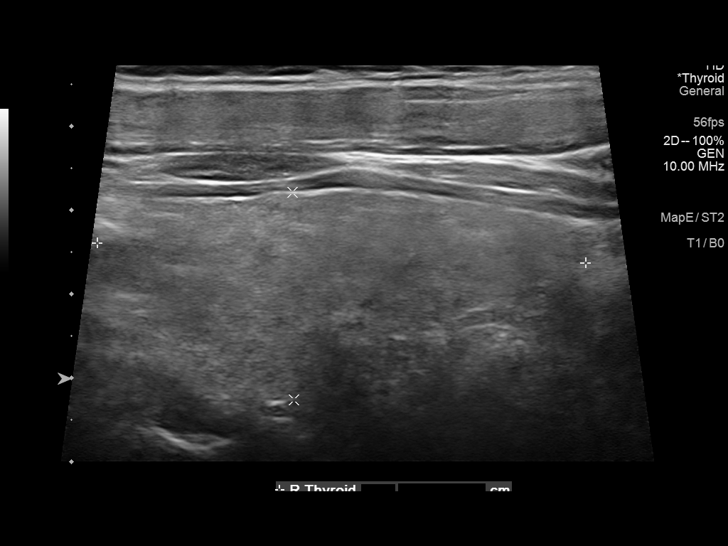
[im 8/46]
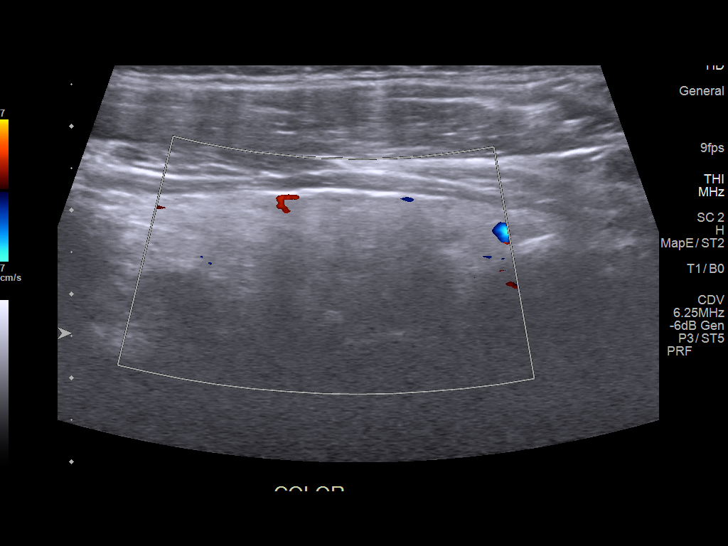
[im 12/46]
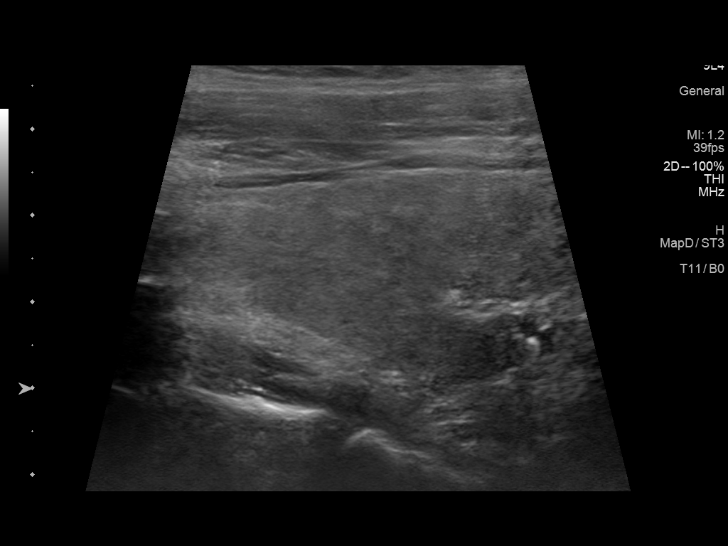
[im 16/46]
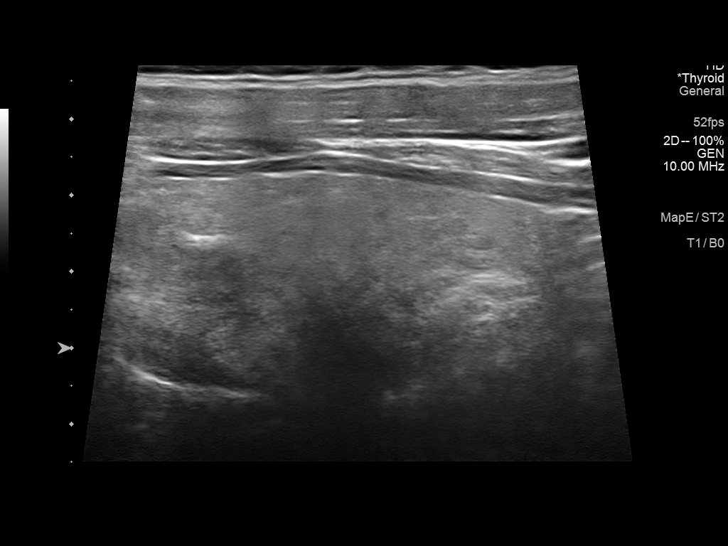
[im 18/46]
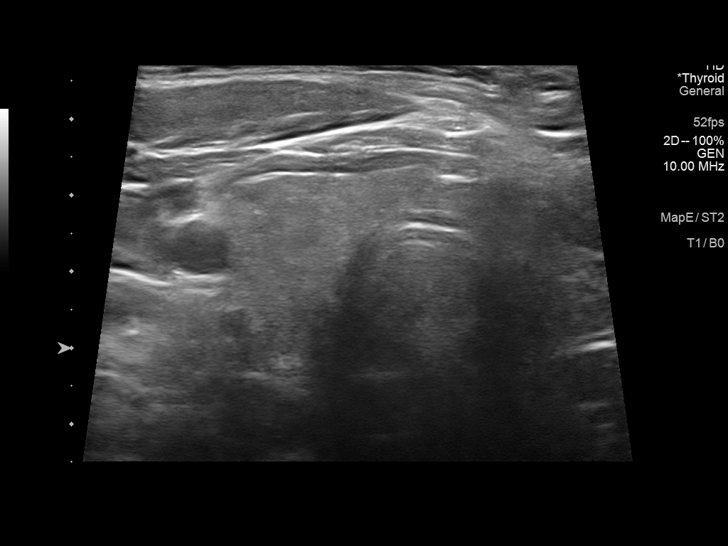
[im 22/46]
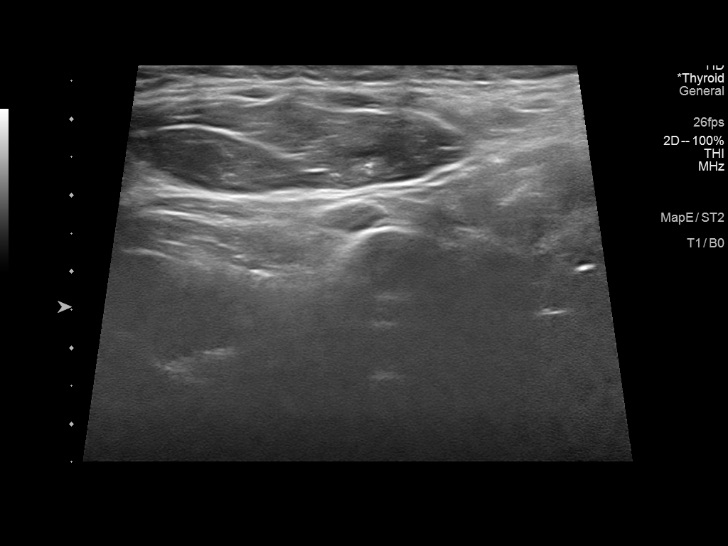
[im 26/46]
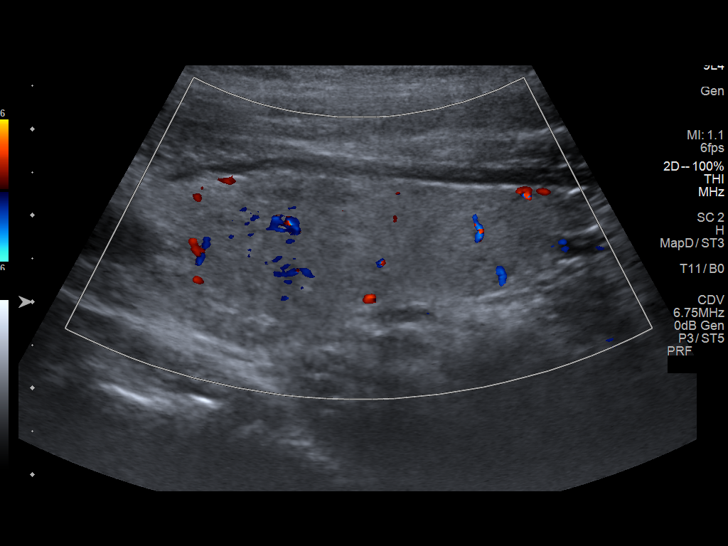
[im 30/46]
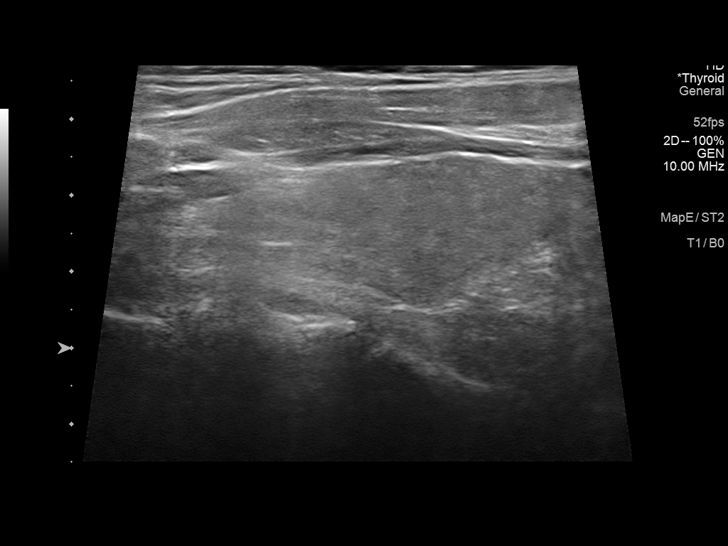
[im 32/46]
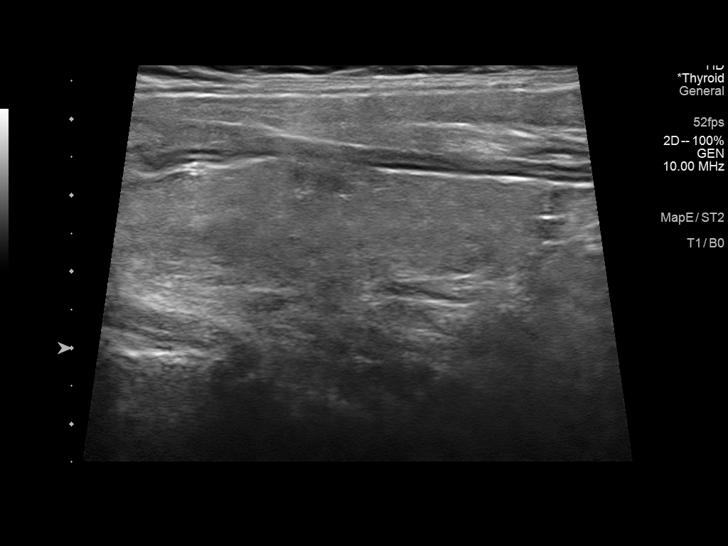
[im 36/46]
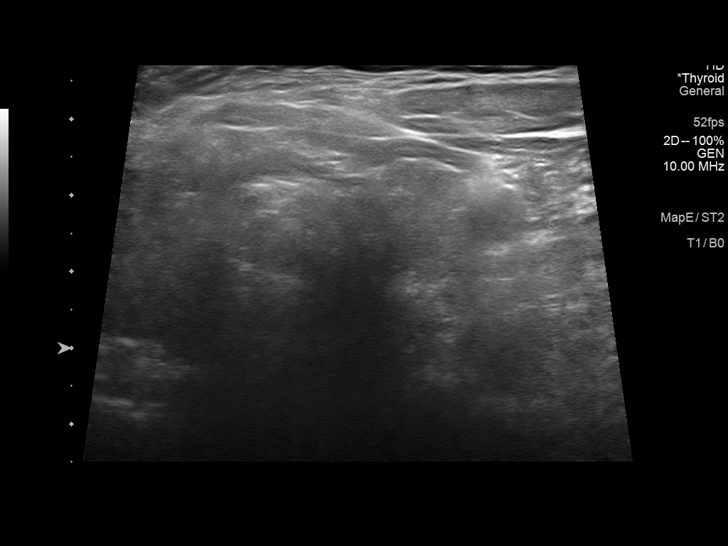
[im 40/46]
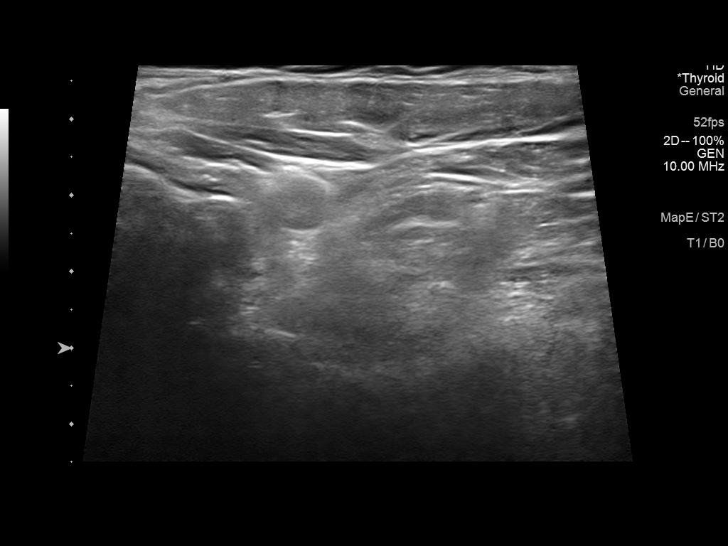
[im 44/46]
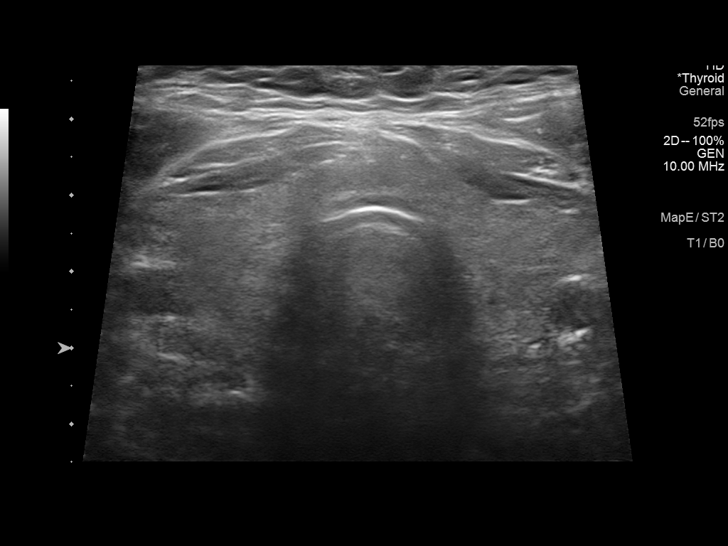

[Series 2001: us thyroid · 0.08mm/px · 1 of 2 slices shown (2 of 2)]
[im 1/2]
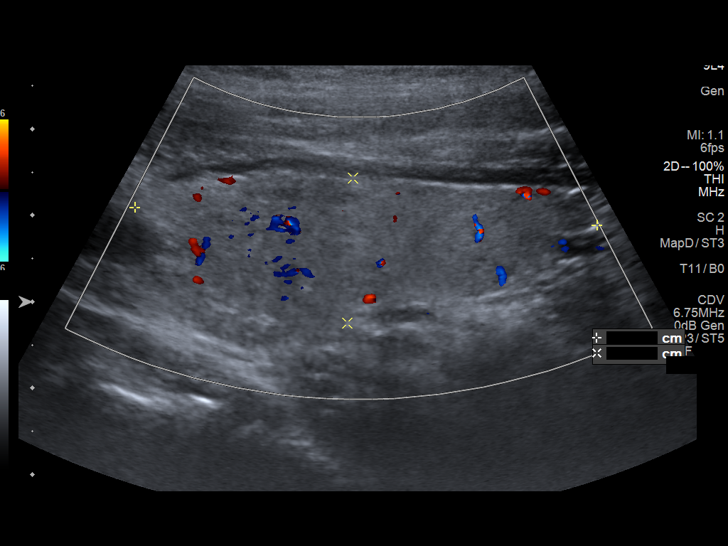

[14 of 25 positions shown; findings below may reference images not displayed]

FINDINGS: Parenchymal Echotexture: Mildly heterogenous

Isthmus: 0.8 cm

Right lobe: 5.8 x 2.5 x 1.9 cm

Left lobe: 5.4 x 1.7 x 1.6 cm

_________________________________________________________

Estimated total number of nodules >/= 1 cm: 0

Number of spongiform nodules >/=  2 cm not described below (TR1): 0

Number of mixed cystic and solid nodules >/= 1.5 cm not described
below (TR2): 0

_________________________________________________________

No discrete nodules are seen within the thyroid gland.

No cervical adenopathy or fluid collection within the imaged neck.
IMPRESSION: Mildly heterogeneous thyroid gland echotexture, without discrete
nodule.

## 2023-11-26 ENCOUNTER — Ambulatory Visit: Attending: Internal Medicine | Admitting: Internal Medicine

## 2023-11-26 ENCOUNTER — Encounter: Payer: Self-pay | Admitting: Internal Medicine

## 2023-11-26 VITALS — BP 118/69 | HR 64 | Temp 98.0°F | Resp 61 | Ht 69.25 in | Wt 257.8 lb

## 2023-11-26 DIAGNOSIS — R899 Unspecified abnormal finding in specimens from other organs, systems and tissues: Secondary | ICD-10-CM

## 2023-11-26 DIAGNOSIS — K5909 Other constipation: Secondary | ICD-10-CM | POA: Diagnosis not present

## 2023-11-26 DIAGNOSIS — R768 Other specified abnormal immunological findings in serum: Secondary | ICD-10-CM | POA: Diagnosis not present

## 2023-11-26 DIAGNOSIS — G629 Polyneuropathy, unspecified: Secondary | ICD-10-CM | POA: Diagnosis not present

## 2023-11-26 NOTE — Progress Notes (Signed)
 Office Visit Note  Patient: Eddie Murray             Date of Birth: [birth year]           MRN: [id number]             PCP: Lucius Krabbe, NP Referring: Lucius Krabbe, NP Visit Date: 11/26/2023 Occupation: Data Unavailable  Subjective:  New Patient (Initial Visit) (Abnormal Labs, Pain in right hip. )   Discussed the use of AI scribe software for clinical note transcription with the patient, who gave verbal consent to proceed.  History of Present Illness   Eddie Murray is a 38 year old male who presents with ongoing symptoms including joint pain, paresthesias, spasms, and cytopenias concerning for possible autoimmune conditions.  Since 2019, he has experienced episodes of left-sided numbness, accompanied by gastrointestinal issues and nerve problems. These symptoms have led to multiple ER visits where elevated blood pressure was noted, but EKGs were normal and no specific etiology identified. He experiences constipation and nerve dysregulation during these episodes, which he has managed to regulate better over time.  He describes radiating pain through his left arm and occasional hand spasms, occurring in waves and more manageable now. He associates these symptoms with stress and anxiety, which he believes are related to his gastrointestinal issues. He has tried SSRIs without success and currently uses clonazepam as needed, approximately four to five times a month, which he finds effective.  He has a history of abnormal lab results, including a moderately high positive ANA test and fluctuating blood counts. In April his testing was repeated and positive with 1:320 ANA titer, prompting his referral. He regularly monitors his blood work, which showed a significant drop in blood count in September of the previous year, followed by a return to baseline.  He experiences vertigo, described as lightheadedness rather than room spinning, and occasional cold feet. No visible swelling,  rashes, or discoloration during numbness episodes. He also reports foamy urine for the past two years, with normal kidney tests.  He has a history of significant weight loss, having lost over 250 pounds since 2019, when he weighed about 500 pounds. 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  No family history of autoimmune diseases, although his father may have age-related arthritis. He has been experiencing ongoing gastrointestinal issues throughout the summer.     Labs reviewed 06/2023 ANA 1:320 nucleolar 11 Rfl Pnl neg Ttg/giladin neg  05/2021 Tg 35.5 Tpo neg  Activities of Daily Living:  Patient reports morning stiffness for 30 minutes.   Patient Denies nocturnal pain.  Difficulty dressing/grooming: Denies Difficulty climbing stairs: Denies Difficulty getting out of chair: Denies Difficulty using hands for taps, buttons, cutlery, and/or writing: Denies  Review of Systems  Constitutional:  Negative for fatigue.  HENT:  Negative for mouth sores and mouth dryness.   Eyes:  Positive for dryness.  Respiratory:  Positive for shortness of breath.   Cardiovascular:  Negative for chest pain and palpitations.  Gastrointestinal:  Positive for constipation. Negative for blood in stool and diarrhea.  Endocrine: Negative for increased urination.  Genitourinary:  Negative for involuntary urination.  Musculoskeletal:  Positive for joint pain, joint pain, joint swelling and morning stiffness. Negative for gait problem, myalgias, muscle weakness, muscle tenderness and myalgias.  Skin:  Positive for rash, hair loss and sensitivity to sunlight. Negative for color change.  Allergic/Immunologic: Negative for susceptible to infections.  Neurological:  Positive for dizziness and headaches.  Hematological:  Negative for swollen glands.  Psychiatric/Behavioral:  Positive for sleep disturbance. Negative for depressed mood. The patient is nervous/anxious.     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   History of vitamin D  deficiency 06/12/2023   Family history of celiac disease 06/12/2023   Family history of thyroid  disease 06/12/2023   History of non anemic vitamin B12 deficiency 06/12/2023   Tinnitus of both ears 06/12/2023   Generalized anxiety disorder 04/12/2021   Constipation    Grade I internal hemorrhoids    Gastroesophageal reflux disease without esophagitis    Duodenal nodule    Tendinosis of left shoulder 11/30/2020   Essential hypertension 04/04/2020   Hyperlipidemia 12/31/2019   Neuropathy 10/01/2017    Past Medical History:  Diagnosis Date   Abdominal pain, epigastric    Anxiety 11/21/2018   Anxiety disorder    Body mass index (BMI) of 50-59.9 in adult (HCC) 04/12/2021   Change in bowel habits    Chest pain of uncertain etiology 04/04/2020   Constipation    DOE (dyspnea on exertion) 04/04/2020   Gastroesophageal reflux disease 01/13/2020   GERD (gastroesophageal reflux disease)    Gingivitis 01/13/2020   Hypertension    IBS (irritable bowel syndrome)    Impaired fasting glucose 04/12/2021   Insulin resistance 11/30/2020   Intertrigo 10/01/2017   Left sided lateral abdominal maceration with induration noted between skin folds.  No fluctuance.  Will prescribe nystatin cream and given written information for supportive care.        Mixed anxiety and depressive disorder 06/03/2019   Morbid obesity (HCC) 04/04/2020   Obesity    OSA (obstructive sleep apnea)    Rectal bleeding    Tobacco abuse 12/31/2019    Family History  Problem Relation Age of Onset   Thyroid  disease Mother    Hypertension Mother    Parkinson's disease Mother    Diabetes Father    Hypertension Father    Thyroid  disease Maternal Grandfather    Past Surgical History:  Procedure Laterality Date   BIOPSY   03/09/2021   Procedure: BIOPSY;  Surgeon: San Sandor GAILS, DO;  Location: [facility];  Service: Gastroenterology;;  EGD and COLON   CHOLECYSTECTOMY  2012   COLONOSCOPY WITH PROPOFOL  N/A 03/09/2021   Procedure: COLONOSCOPY WITH PROPOFOL ;  Surgeon: San Sandor GAILS, DO;  Location: [facility];  Service: Gastroenterology;  Laterality: N/A;   ESOPHAGOGASTRODUODENOSCOPY (EGD) WITH PROPOFOL  N/A 03/09/2021   Procedure: ESOPHAGOGASTRODUODENOSCOPY (EGD) WITH PROPOFOL ;  Surgeon: San Sandor GAILS, DO;  Location: [facility];  Service: Gastroenterology;  Laterality: N/A;   Social History   Tobacco Use   Smoking status: Former    Types: Cigars    Passive exposure: Never   Smokeless tobacco: Never  Vaping Use   Vaping status: Never Used  Substance Use Topics   Alcohol use: Never   Drug use: Never   Social History   Social History Narrative   Are you right handed or left handed? right   Are you currently employed ? 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    What type of home do you live in: 1 story or 2 story? one    Caffeine 1 cup daily      There is no immunization  history on file for this patient.   Objective: Vital Signs: BP 118/69 (BP Location: Right Arm, Patient Position: Sitting, Cuff Size: Large)   Pulse 64   Temp 98 F (36.7 C)   Resp (!) 61   Ht 5' 9.25 (1.759 m)   Wt 257 lb 12.8 oz (116.9 kg)   BMI 37.80 kg/m    Physical Exam Eyes:     Conjunctiva/sclera: Conjunctivae normal.  Cardiovascular:     Rate and Rhythm: Normal rate and regular rhythm.  Pulmonary:     Effort: Pulmonary effort is normal.     Breath sounds: Normal breath sounds.  Lymphadenopathy:     Cervical: No cervical adenopathy.  Skin:    General: Skin is warm and dry.     Comments: Normal appearing nailfold capillaries  Neurological:     Mental Status: He is alert.  Psychiatric:        Mood and Affect: Mood normal.       Musculoskeletal Exam: Shoulders full ROM no tenderness or swelling Elbows full ROM no tenderness or swelling Wrists full ROM no tenderness or swelling Fingers full ROM no tenderness or swelling No paraspinal tenderness to palpation over upper and lower back Hip normal internal and external rotation without pain, no tenderness to lateral hip palpation Knees full ROM no tenderness or swelling Ankles full ROM no tenderness or swelling    Investigation: No additional findings.  Imaging: No results found.  Recent Labs: Lab Results  Component Value Date   WBC 7.9 06/24/2023   HGB 15.6 06/24/2023   PLT 152 06/24/2023   NA 139 06/24/2023   K 4.0 06/24/2023   CL 104 06/24/2023   CO2 24 06/24/2023   GLUCOSE 144 (H) 06/24/2023   BUN 21 (H) 06/24/2023   CREATININE 0.96 06/24/2023   BILITOT 1.6 (H) 06/24/2023   ALKPHOS 34 (L) 06/24/2023   AST 31 06/24/2023   ALT 26 06/24/2023   PROT 7.1 06/24/2023   ALBUMIN 4.0 06/24/2023   CALCIUM 9.1 06/24/2023    Speciality Comments: No specialty comments available.  Procedures:  No procedures performed Allergies: Patient has no known allergies.   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 Differential includes autoimmune conditions, histamine allergy, and GI issues. Previous lupus and scleroderma tests looked pretty good. Further investigation needed. - Order blood tests for specific antibodies including smooth muscle and mitochondrial antibodies. - Order tests for markers of inflammation such as sedimentation rate, C-reactive protein, complements, and immunoglobulin titers. - If specific etiology suggested can f/u to discuss  treatment or additional test, otherwise monitor and may need to try seeing during an actual symptom episode flare up  Chronic gastrointestinal symptoms Chronic constipation and abdominal discomfort managed with mobility pills. Linzess  ineffective. Possible relation to autoimmune or GI issues.  Chronic paresthesias and neuropathic symptoms Chronic paresthesias and neuropathic symptoms, primarily left-sided, associated with stress and anxiety. No visible swelling, rashes, or discoloration.  Chronic anxiety symptoms Chronic anxiety managed with clonazepam. SSRIs ineffective. Symptoms linked to stress and may exacerbate other conditions.   Orders: Orders Placed This Encounter  Procedures   Sedimentation rate   C-reactive protein   C3 and C4   Anti-smooth muscle antibody, IgG   Mitochondrial antibodies   IgG, IgA, IgM  No orders of the defined types were placed in this encounter.    Follow-Up Instructions: No follow-ups on file.   Lonni LELON Ester, MD  Note - This record has been created using [employer].  Chart creation errors have been sought, but may not always  have been located. Such creation errors do not reflect on  the standard of medical care.

## 2023-11-28 LAB — IGG, IGA, IGM
IgG (Immunoglobin G), Serum: 1515 mg/dL (ref 600–1640)
IgM, Serum: 205 mg/dL (ref 50–300)
Immunoglobulin A: 195 mg/dL (ref 47–310)

## 2023-11-28 LAB — C3 AND C4
C3 Complement: 96 mg/dL (ref 82–185)
C4 Complement: 14 mg/dL — ABNORMAL LOW (ref 15–53)

## 2023-11-28 LAB — ANTI-SMOOTH MUSCLE ANTIBODY, IGG: Actin (Smooth Muscle) Antibody (IGG): <20 U (ref <20)

## 2023-11-28 LAB — SEDIMENTATION RATE: Sed Rate: 6 mm/h (ref 0–15)

## 2023-11-28 LAB — MITOCHONDRIAL ANTIBODIES: Mitochondrial M2 Ab, IgG: <=20 U (ref <=20.0)

## 2023-11-28 LAB — C-REACTIVE PROTEIN: CRP: <3 mg/L (ref <8.0)

## 2023-12-25 ENCOUNTER — Ambulatory Visit: Admitting: "Endocrinology

## 2023-12-26 ENCOUNTER — Ambulatory Visit: Admitting: Internal Medicine

## 2024-01-03 ENCOUNTER — Encounter: Payer: Self-pay | Admitting: "Endocrinology

## 2024-01-03 ENCOUNTER — Ambulatory Visit: Admitting: "Endocrinology

## 2024-01-03 VITALS — BP 102/80 | HR 84 | Ht 69.0 in | Wt 251.0 lb

## 2024-01-03 DIAGNOSIS — R7989 Other specified abnormal findings of blood chemistry: Secondary | ICD-10-CM | POA: Diagnosis not present

## 2024-01-03 NOTE — Progress Notes (Signed)
 Outpatient Endocrinology Note Obadiah Birmingham, MD  01/03/24   Eddie Murray 1985/09/14 [id number]  Referring Provider: Lucius Krabbe, NP Primary Care Provider: Lucius Krabbe, NP Subjective  No chief complaint on file.   Assessment & Plan  Diagnoses and all orders for this visit:  Abnormal thyroid  blood test  Low serum cortisol level    Eddie Murray is currently taking not taking any thyroid  medication. Patient is currently biochemically euthyroid. + Thyroglobulin antibody not relevant in this case Patient would like repeat labs done with PCP. Educated on thyroid  axis.  Repeat lab before next visit or sooner if symptoms of hyperthyroidism or hypothyroidism develop.  Notify us  immediately in case of pregnancy/breastfeeding or significant weight gain or loss. Counseled on compliance and follow up needs.  03/16/2021 thyroid  ultrasound reported mildly heterogeneous thyroid  gland echotexture, without discrete nodule. No follow-up indicated at this time  Noticeable low cortisol level in the past: 5.2 in 05/25/2021 Patient has no symptoms of weight loss/abdominal pain/diarrhea  Patient refuses cortisol check at this time  Counseled extensively about symptoms and natural ways to approach including alternative medicine approach   I have reviewed current medications, nurse's notes, allergies, vital signs, past medical and surgical history, family medical history, and social history for this encounter. Counseled patient on symptoms, examination findings, lab findings, imaging results, treatment decisions and monitoring and prognosis. The patient understood the recommendations and agrees with the treatment plan. All questions regarding treatment plan were fully answered.  Patient will follow-up as needed No follow-ups on file.   Obadiah Birmingham, MD  01/03/24   I have reviewed current medications, nurse's notes, allergies, vital signs, past medical and surgical  history, family medical history, and social history for this encounter. Counseled patient on symptoms, examination findings, lab findings, imaging results, treatment decisions and monitoring and prognosis. The patient understood the recommendations and agrees with the treatment plan. All questions regarding treatment plan were fully answered.   History of Present Illness Eddie Murray is a 38 y.o. year old male who presents to our clinic with +ve thyroglobulin antibody.  Reports always feeling different, comes in waves, since 4 years, sometimes feels low energy/brain fog.  Patient has seen several specialist in not been diagnosed with any particular disease.  No strong family history of thyroid  disease/hyperthyroidism  Symptoms suggestive of HYPOTHYROIDISM:  fatigue Yes, sometimes  weight gain No cold intolerance  No constipation  Yes  Symptoms suggestive of HYPERTHYROIDISM:  weight loss  No heat intolerance No hyperdefecation  No palpitations  No  Compressive symptoms:  dysphagia  No dysphonia  No positional dyspnea (especially with simultaneous arms elevation)  No  Smokes  No On biotin  No Personal history of head/neck surgery/irradiation  No  Physical Exam  BP 102/80   Pulse 84   Ht 5' 9 (1.753 m)   Wt 251 lb (113.9 kg)   SpO2 98%   BMI 37.07 kg/m  Constitutional: well developed, well nourished Head: normocephalic, atraumatic, no exophthalmos Eyes: sclera anicteric, no redness Neck: no thyromegaly, no thyroid  tenderness; no nodules palpated Lungs: normal respiratory effort Neurology: alert and oriented, no fine hand tremor Skin: dry, no appreciable rashes Musculoskeletal: no appreciable defects Psychiatric: normal mood and affect  Allergies No Known Allergies  Current Medications Patient's Medications  New Prescriptions   No medications on file  Previous Medications   CLONAZEPAM (KLONOPIN) 0.5 MG TABLET    Take 0.5 mg by mouth 2 (two) times daily.    PROPRANOLOL ER (INDERAL LA)  60 MG 24 HR CAPSULE    Take 60 mg by mouth daily.  Modified Medications   No medications on file  Discontinued Medications   No medications on file    Past Medical History Past Medical History:  Diagnosis Date   Abdominal pain, epigastric    Anxiety 11/21/2018   Anxiety disorder    Body mass index (BMI) of 50-59.9 in adult (HCC) 04/12/2021   Change in bowel habits    Chest pain of uncertain etiology 04/04/2020   Constipation    DOE (dyspnea on exertion) 04/04/2020   Gastroesophageal reflux disease 01/13/2020   GERD (gastroesophageal reflux disease)    Gingivitis 01/13/2020   Hypertension    IBS (irritable bowel syndrome)    Impaired fasting glucose 04/12/2021   Insulin resistance 11/30/2020   Intertrigo 10/01/2017   Left sided lateral abdominal maceration with induration noted between skin folds.  No fluctuance.  Will prescribe nystatin cream and given written information for supportive care.        Mixed anxiety and depressive disorder 06/03/2019   Morbid obesity (HCC) 04/04/2020   Obesity    OSA (obstructive sleep apnea)    Rectal bleeding    Tobacco abuse 12/31/2019    Past Surgical History Past Surgical History:  Procedure Laterality Date   BIOPSY  03/09/2021   Procedure: BIOPSY;  Surgeon: San Sandor GAILS, DO;  Location: [facility];  Service: Gastroenterology;;  EGD and COLON   CHOLECYSTECTOMY  2012   COLONOSCOPY WITH PROPOFOL  N/A 03/09/2021   Procedure: COLONOSCOPY WITH PROPOFOL ;  Surgeon: San Sandor GAILS, DO;  Location: [facility];  Service: Gastroenterology;  Laterality: N/A;   ESOPHAGOGASTRODUODENOSCOPY (EGD) WITH PROPOFOL  N/A 03/09/2021   Procedure: ESOPHAGOGASTRODUODENOSCOPY (EGD) WITH PROPOFOL ;  Surgeon: San Sandor GAILS, DO;  Location: [facility];  Service: Gastroenterology;  Laterality: N/A;    Family History family history includes Diabetes in his father; Hypertension in his father and mother; Parkinson's disease  in his mother; Thyroid  disease in his maternal grandfather and mother.  Social History Social History   Socioeconomic History   Marital status: Single    Spouse name: Not on file   Number of children: Not on file   Years of education: Not on file   Highest education level: 12th grade  Occupational History   Not on file  Tobacco Use   Smoking status: Former    Types: Cigars    Passive exposure: Never   Smokeless tobacco: Never  Vaping Use   Vaping status: Never Used  Substance and Sexual Activity   Alcohol use: Never   Drug use: Never   Sexual activity: Not Currently  Other Topics Concern   Not on file  Social History Narrative   Are you right handed or left handed? right   Are you currently employed ? 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    What type of home do you live in: 1 story or 2 story? one    Caffeine 1 cup daily   Social Drivers of Health   Financial Resource Strain: Low Risk  (06/11/2023)   Overall Financial Resource Strain (CARDIA)    Difficulty of Paying Living Expenses: Not hard at all  Food Insecurity: No Food Insecurity (06/11/2023)   Hunger Vital Sign    Worried About Running Out of Food in the Last Year: Never true    Ran Out of Food in the Last  Year: Never true  Transportation Needs: No Transportation Needs (06/11/2023)   PRAPARE - Administrator, Civil Service (Medical): No    Lack of Transportation (Non-Medical): No  Physical Activity: Sufficiently Active (09/11/2023)   Exercise Vital Sign    Days of Exercise per Week: 4 days    Minutes of Exercise per Session: 120 min  Stress: No Stress Concern Present (09/11/2023)   [employer] of Occupational Health - Occupational Stress Questionnaire    Feeling of Stress: Only a little  Social Connections: Unknown (06/11/2023)   Social Connection and Isolation Panel    Frequency of Communication with Friends and Family: More than  three times a week    Frequency of Social Gatherings with Friends and Family: More than three times a week    Attends Religious Services: More than 4 times per year    Active Member of Clubs or Organizations: Yes    Attends Banker Meetings: More than 4 times per year    Marital Status: Patient declined  Intimate Partner Violence: Unknown (06/15/2021)   Received from [facility]   HITS    Physically Hurt: Not on file    Insult or Talk Down To: Not on file    Threaten Physical Harm: Not on file    Scream or Curse: Not on file    Laboratory Investigations Lab Results  Component Value Date   TSH 1.56 06/14/2023   TSH 1.25 11/14/2022   TSH 1.510 05/25/2021   FREET4 0.90 06/14/2023   FREET4 1.25 05/25/2021     No results found for: TSI   No components found for: TRAB   Lab Results  Component Value Date   CHOL 166 06/14/2023   Lab Results  Component Value Date   HDL 51.10 06/14/2023   Lab Results  Component Value Date   LDLCALC 104 (H) 06/14/2023   Lab Results  Component Value Date   TRIG 53.0 06/14/2023   Lab Results  Component Value Date   CHOLHDL 3 06/14/2023   Lab Results  Component Value Date   CREATININE 0.96 06/24/2023   Lab Results  Component Value Date   GFR 109.04 06/14/2023      Component Value Date/Time   NA 139 06/24/2023 0152   NA 137 05/25/2021 1002   K 4.0 06/24/2023 0152   CL 104 06/24/2023 0152   CO2 24 06/24/2023 0152   GLUCOSE 144 (H) 06/24/2023 0152   BUN 21 (H) 06/24/2023 0152   BUN 15 05/25/2021 1002   CREATININE 0.96 06/24/2023 0152   CALCIUM 9.1 06/24/2023 0152   PROT 7.1 06/24/2023 0152   PROT 7.7 05/25/2021 1002   ALBUMIN 4.0 06/24/2023 0152   ALBUMIN 4.5 05/25/2021 1002   AST 31 06/24/2023 0152   ALT 26 06/24/2023 0152   ALKPHOS 34 (L) 06/24/2023 0152   BILITOT 1.6 (H) 06/24/2023 0152   BILITOT 0.7 05/25/2021 1002   GFRNONAA >60 06/24/2023 0152      Latest Ref Rng & Units 06/24/2023    1:52 AM  06/14/2023   10:36 AM 11/14/2022    2:56 PM  BMP  Glucose 70 - 99 mg/dL 855  81  67   BUN 6 - 20 mg/dL 21  19  21    Creatinine 0.61 - 1.24 mg/dL 9.03  9.08  8.95   Sodium 135 - 145 mmol/L 139  137  139   Potassium 3.5 - 5.1 mmol/L 4.0  4.1  4.3   Chloride 98 -  111 mmol/L 104  102  104   CO2 22 - 32 mmol/L 24  26  30    Calcium 8.9 - 10.3 mg/dL 9.1  9.3  9.1        Component Value Date/Time   WBC 7.9 06/24/2023 0152   RBC 5.15 06/24/2023 0152   HGB 15.6 06/24/2023 0152   HGB 16.1 05/25/2021 1002   HCT 46.2 06/24/2023 0152   HCT 47.0 05/25/2021 1002   PLT 152 06/24/2023 0152   PLT 196 05/25/2021 1002   MCV 89.7 06/24/2023 0152   MCV 86 05/25/2021 1002   MCH 30.3 06/24/2023 0152   MCHC 33.8 06/24/2023 0152   RDW 12.5 06/24/2023 0152   RDW 12.3 05/25/2021 1002   LYMPHSABS 1.5 06/14/2023 1036   MONOABS 0.4 06/14/2023 1036   EOSABS 0.2 06/14/2023 1036   BASOSABS 0.0 06/14/2023 1036      Parts of this note may have been dictated using voice recognition software. There may be variances in spelling and vocabulary which are unintentional. Not all errors are proofread. Please notify the dino if any discrepancies are noted or if the meaning of any statement is not clear.

## 2024-03-03 NOTE — Progress Notes (Signed)
 "    03/04/2024 Eddie Murray [id number] 07-04-1985  Referring provider: Lucius Krabbe, NP Primary GI doctor: Dr. San  ASSESSMENT AND PLAN:   Irritable bowel syndrome with constipation Colon 2022 with mild active ileitis, no chronicity, likely NSAID related, recent normal ESR/CRP, recall age 37 08/21/2022 CT abdomen pelvis with contrast shows constipation otherwise unremarkable Patient's failed MiraLAX, fiber, Dulcolax, Amitiza , trulance  Linzess  290 mcg every other day and having good results however insurance denied -IBS information given the patient -continue Bentyl  as needed - Consider SIBO testing or treatment  Gastroesophageal reflux disease with early satiety and weight loss 2019 EGD non-H. pylori gastritis 2024 GES negative Patient is completely changed his diet to eat once daily with more liquids than solids, has lost 200 pounds since 2019 Symptoms have improved, with improvement in his constipation Will refill Pepcid  as needed, well controlled as needed with this  Chest tightness with associated SOB, fast heart beats and with flushing, Burping helps Normal stress test 06/05/2022 Has history of elevated thyroid  antibodies negative CRP/sed rate 11/2022, had normal thyroid  ? SIBO, getting test, consider treatment Follow up with PCP for further evaluatoin Consider continuing daily anxiety medication  Will follow up 3 months, will call back sooner if any issues.    Patient Care Team: Lucius Krabbe, NP as PCP - General (Family Medicine) Santo Stanly LABOR, MD as PCP - Cardiology (Cardiology)  HISTORY OF PRESENT ILLNESS: 38 y.o. male with a past medical history of anxiety, HTN, OSA, depression, obesity, GERD, and others listed below presents for evaluation of IBS flare.   Discussed the use of AI scribe software for clinical note transcription with the patient, who gave verbal consent to proceed.  History of Present Illness   Eddie Murray is a 38  year old male who presents for evaluation of persistent left-sided abdominal discomfort, bloating, and gas.  Reports ongoing discomfort localized to the left abdomen, described as pressure and fullness, particularly after eating. Attributes symptoms to trapped gas, which he feels may push up against his chest, causing shortness of breath and a sensation of his heart pumping harder, especially during exercise. These symptoms impair his workout performance, with variability depending on symptom severity.  Persistent bloating and excessive gas are present. Dietary modifications, including fasting for up to 40-50 hours, provide some relief, but shortness of breath and pressure persist. Continues to drink fluids during fasting, including coffee, water, and Diet Coke, but reducing Diet Coke intake to one or two per day has not improved symptoms.  Constipation is managed with over-the-counter agents, which he finds effective and better tolerated than previous treatment with Trulance , which was discontinued due to diarrhea. Reports satisfactory bowel movements with current regimen.  Despite increased physical activity, including running ten miles daily and working out four times per week for the past three months, he has had difficulty losing weight and his weight has remained stable.  Extensive prior workup includes CT scan showing constipation, colonoscopy with mild inflammation without chronicity, endoscopy with some inflammation, negative inflammatory markers, and a normal gastric emptying study.  Has not identified any specific dietary triggers, including artificial sweeteners or carbonated beverages, that worsen symptoms.       He  reports that he has quit smoking. His smoking use included cigars. He has never been exposed to tobacco smoke. He has never used smokeless tobacco. He reports that he does not drink alcohol and does not use drugs.   Wt Readings from Last 3 Encounters:  03/04/24 249 lb  12.8  oz (113.3 kg)  01/03/24 251 lb (113.9 kg)  11/26/23 257 lb 12.8 oz (116.9 kg)    RELEVANT LABS AND IMAGING:  - 02/2018: EGD ([facility]): mild reflux esophagitis, biopsies negative for Barrett's esophagus and eosinophilic esophagitis. Biopsies from the small bowel negative for celiac disease. - 04/11/2020: Evaluated by [facility] for abdominal pain, cramping, gas, bloating, and spasms. At that time discussed similar symptoms in 2019.  Recommended sertraline for treatment of underlying anxiety, but patient stopped due to weight gain. - 11/16/2020: Initial appointment in [facility] for evaluation of generalized abdominal cramping, gas, borborygmi, changes in bowel habits (constipation with BM every 3-4 days).  Reportedly trialed high-fiber diet but that resulted in BRBPR.  Took MiraLAX but stopped after 1 dose due to watery stools.  Strong association with underlying anxiety and GI symptoms, with history of panic attacks.   EGD/colonoscopy at [facility] due to morbid obesity - 03/09/2021: EGD: Normal esophagus, stomach (path: Chronic non-H. pylori gastritis).  10 mm nodule in D2 (path: Peptic duodenitis).  Remainder of the duodenum was normal.  Recommended Prilosec 40 mg p.o. twice daily x 6 weeks then titrate to lowest effective dose - 03/09/2021: Colonoscopy: Normal, small internal hemorrhoids.  Mild inflammatory changes at the IC valve, but remainder of the ileum was normal (path: Patchy, mild active ileitis without features of chronicity).   Repeat colonoscopy at age 75 for ongoing screening  06/22/2022 office visit with Dr. San for generalized abdominal pain, constipation.  Given trial of Amitiza  24 mcg twice daily, refilled Bentyl  10 mg.  Discussed hemorrhoidal band ligation. Patient has been on FD guard and IBgard, peppermint oil. Has tried and failed MiraLAX, Dulcolax, beet juice. 08/27/2022 patient seen in the office by myself with complaint of slow digestion lost to  60 to 70 pounds since 2019 a lot of burping, belching throbbing upper abdominal pain worse with constipation. 08/21/2022 CT AB and pelvis with contrast showed no acute intra abdominal process but showed increased stool burden.  Had unremarkable gastric emptying study. At that visit he was started on Linzess  290 mcg.   CBC    Component Value Date/Time   WBC 7.9 06/24/2023 0152   RBC 5.15 06/24/2023 0152   HGB 15.6 06/24/2023 0152   HGB 16.1 05/25/2021 1002   HCT 46.2 06/24/2023 0152   HCT 47.0 05/25/2021 1002   PLT 152 06/24/2023 0152   PLT 196 05/25/2021 1002   MCV 89.7 06/24/2023 0152   MCV 86 05/25/2021 1002   MCH 30.3 06/24/2023 0152   MCHC 33.8 06/24/2023 0152   RDW 12.5 06/24/2023 0152   RDW 12.3 05/25/2021 1002   LYMPHSABS 1.5 06/14/2023 1036   MONOABS 0.4 06/14/2023 1036   EOSABS 0.2 06/14/2023 1036   BASOSABS 0.0 06/14/2023 1036   Recent Labs    06/14/23 1036 06/24/23 0152  HGB 15.0 15.6    CMP     Component Value Date/Time   NA 139 06/24/2023 0152   NA 137 05/25/2021 1002   K 4.0 06/24/2023 0152   CL 104 06/24/2023 0152   CO2 24 06/24/2023 0152   GLUCOSE 144 (H) 06/24/2023 0152   BUN 21 (H) 06/24/2023 0152   BUN 15 05/25/2021 1002   CREATININE 0.96 06/24/2023 0152   CALCIUM 9.1 06/24/2023 0152   PROT 7.1 06/24/2023 0152   PROT 7.7 05/25/2021 1002   ALBUMIN 4.0 06/24/2023 0152   ALBUMIN 4.5 05/25/2021 1002   AST 31 06/24/2023 0152   ALT 26  06/24/2023 0152   ALKPHOS 34 (L) 06/24/2023 0152   BILITOT 1.6 (H) 06/24/2023 0152   BILITOT 0.7 05/25/2021 1002   GFRNONAA >60 06/24/2023 0152      Latest Ref Rng & Units 06/24/2023    1:52 AM 06/14/2023   10:36 AM 11/14/2022    2:56 PM  Hepatic Function  Total Protein 6.5 - 8.1 g/dL 7.1  7.3  7.1   Albumin 3.5 - 5.0 g/dL 4.0  4.3  4.0   AST 15 - 41 U/L 31  25  24    ALT 0 - 44 U/L 26  19  22    Alk Phosphatase 38 - 126 U/L 34  37  44   Total Bilirubin 0.0 - 1.2 mg/dL 1.6  0.9  0.6       Current Medications:    Current Outpatient Medications (Cardiovascular):    propranolol ER (INDERAL LA) 60 MG 24 hr capsule, Take 60 mg by mouth daily. (Patient taking differently: Take 60 mg by mouth daily as needed.)  Current Outpatient Medications (Other):    clonazePAM (KLONOPIN) 0.5 MG tablet, Take 0.5 mg by mouth 2 (two) times daily.  Medical History:  Past Medical History:  Diagnosis Date   Abdominal pain, epigastric    Anxiety 11/21/2018   Anxiety disorder    Body mass index (BMI) of 50-59.9 in adult (HCC) 04/12/2021   Change in bowel habits    Chest pain of uncertain etiology 04/04/2020   Constipation    DOE (dyspnea on exertion) 04/04/2020   Gastroesophageal reflux disease 01/13/2020   GERD (gastroesophageal reflux disease)    Gingivitis 01/13/2020   Hypertension    IBS (irritable bowel syndrome)    Impaired fasting glucose 04/12/2021   Insulin resistance 11/30/2020   Intertrigo 10/01/2017   Left sided lateral abdominal maceration with induration noted between skin folds.  No fluctuance.  Will prescribe nystatin cream and given written information for supportive care.        Mixed anxiety and depressive disorder 06/03/2019   Morbid obesity (HCC) 04/04/2020   Obesity    OSA (obstructive sleep apnea)    Rectal bleeding    Tobacco abuse 12/31/2019   Allergies: No Known Allergies   Surgical History:  He  has a past surgical history that includes Colonoscopy with propofol  (N/A, 03/09/2021); Esophagogastroduodenoscopy (egd) with propofol  (N/A, 03/09/2021); biopsy (03/09/2021); and Cholecystectomy (2012). Family History:  His family history includes Diabetes in his father; Hypertension in his father and mother; Parkinson's disease in his mother; Thyroid  disease in his maternal grandfather and mother.  REVIEW OF SYSTEMS  : All other systems reviewed and negative except where noted in the History of Present Illness.  PHYSICAL EXAM: BP 120/80   Pulse 70   Ht 5' 8 (1.727 m)   Wt 249 lb 12.8  oz (113.3 kg)   BMI 37.98 kg/m  General Appearance: Well nourished, in no apparent distress. Head:   Normocephalic and atraumatic. Eyes:  sclerae anicteric,conjunctive pink  Respiratory: Respiratory effort normal, BS equal bilaterally without rales, rhonchi, wheezing. Cardio: RRR with no MRGs. Peripheral pulses intact.  Abdomen: Soft,  Non-distended ,active bowel sounds. mild tenderness in the LUQ. Without guarding and Without rebound. No masses. Rectal: Not evaluated Musculoskeletal: Full ROM, Normal gait. Without edema. Skin:  Dry and intact without significant lesions or rashes Neuro: Alert and  oriented x4;  No focal deficits. Psych:  Cooperative. Normal mood and affect.    Alan JONELLE Coombs, PA-C 10:20 AM   "

## 2024-03-04 ENCOUNTER — Encounter: Payer: Self-pay | Admitting: Physician Assistant

## 2024-03-04 ENCOUNTER — Telehealth: Payer: Self-pay

## 2024-03-04 ENCOUNTER — Ambulatory Visit: Admitting: Physician Assistant

## 2024-03-04 VITALS — BP 120/80 | HR 70 | Ht 68.0 in | Wt 249.8 lb

## 2024-03-04 DIAGNOSIS — K219 Gastro-esophageal reflux disease without esophagitis: Secondary | ICD-10-CM | POA: Diagnosis not present

## 2024-03-04 DIAGNOSIS — K581 Irritable bowel syndrome with constipation: Secondary | ICD-10-CM | POA: Diagnosis not present

## 2024-03-04 DIAGNOSIS — R079 Chest pain, unspecified: Secondary | ICD-10-CM

## 2024-03-04 DIAGNOSIS — R6881 Early satiety: Secondary | ICD-10-CM | POA: Diagnosis not present

## 2024-03-04 DIAGNOSIS — R109 Unspecified abdominal pain: Secondary | ICD-10-CM

## 2024-03-04 DIAGNOSIS — R232 Flushing: Secondary | ICD-10-CM

## 2024-03-04 DIAGNOSIS — R000 Tachycardia, unspecified: Secondary | ICD-10-CM

## 2024-03-04 DIAGNOSIS — R0602 Shortness of breath: Secondary | ICD-10-CM | POA: Diagnosis not present

## 2024-03-04 DIAGNOSIS — R634 Abnormal weight loss: Secondary | ICD-10-CM

## 2024-03-04 NOTE — Telephone Encounter (Signed)
 6 page successful confirmation of requisition form demographics and insurance card with fax cover sheet sent to [email or web address]  T [phone] F [phone]

## 2024-03-04 NOTE — Patient Instructions (Addendum)
 _______________________________________________________  If your blood pressure at your visit was 140/90 or greater, please contact your primary care physician to follow up on this.  _______________________________________________________  If you are age 36 or older, your body mass index should be between 23-30. Your Body mass index is 37.98 kg/m. If this is out of the aforementioned range listed, please consider follow up with your Primary Care Provider.  If you are age 45 or younger, your body mass index should be between 19-25. Your Body mass index is 37.98 kg/m. If this is out of the aformentioned range listed, please consider follow up with your Primary Care Provider.   ________________________________________________________  The  GI providers would like to encourage you to use MYCHART to communicate with providers for non-urgent requests or questions.  Due to long hold times on the telephone, sending your provider a message by [facility] may be a faster and more efficient way to get a response.  Please allow 48 business hours for a response.  Please remember that this is for non-urgent requests.  _______________________________________________________  [facility] is using a team-based approach to care.  Your team is made up of your doctor and two to three APPS. Our APPS (Nurse Practitioners and Physician Assistants) work with your physician to ensure care continuity for you. They are fully qualified to address your health concerns and develop a treatment plan. They communicate directly with your gastroenterologist to care for you. Seeing the Advanced Practice Practitioners on your physician's team can help you by facilitating care more promptly, often allowing for earlier appointments, access to diagnostic testing, procedures, and other specialty referrals.   You have been scheduled for an appointment with Alan Coombs PA-C on 05-06-24 at 920am . Please arrive 10 minutes  early for your appointment.  You have been given a testing kit to check for small intestine bacterial overgrowth (SIBO) which is completed by a company named [employer]. Make sure to return your test in the mail using the return mailing label given to you along with the kit. The test order, your demographic and insurance information have all already been sent to the company. [facility] will collect an upfront charge of $109.00 for commercial insurance plans and $229.00 if you are paying cash. The potential remaining total after claim submission and review is $120.00. Make sure to discuss with [facility] PRIOR to having the test to see if they have gotten information from your insurance company as to how much your testing will cost out of pocket, if any. Please contact [facility] at phone number [phone] to get instructions regarding how to perform the test as our office is unable to give specific testing instructions.   Small intestinal bacterial overgrowth (SIBO) occurs when there is an abnormal increase in the overall bacterial population in the small intestine -- particularly types of bacteria not commonly found in that part of the digestive tract. Small intestinal bacterial overgrowth (SIBO) commonly results when a circumstance -- such as surgery or disease -- slows the passage of food and waste products in the digestive tract, creating a breeding ground for bacteria.  Signs and symptoms of SIBO often include: Loss of appetite Abdominal pain Nausea Bloating An uncomfortable feeling of fullness after eating Diarrhea or constipation, depending on the type of gas produced  What foods trigger SIBO? While foods arent the original cause of SIBO, certain foods do encourage the overgrowth of the wrong bacteria in your small intestine. If youre feeding them their favorite foods, theyre going to grow more,  and that will trigger more of your SIBO symptoms. By the same token,  you can help reduce the overgrowth by starving the problematic bacteria of their favorite foods. This strategy has led to a number of proposed SIBO eating plans. The plans vary, and so do individual results. But in general, they tend to recommend limiting carbohydrates.  These include: Sugars and sweeteners. Fruits and starchy vegetables. Dairy products. Grains.  There is a test for this we can do called a breath test, if you are positive we will treat you with an antibiotic to see if it helps.  Your symptoms are very suspicious for this condition, as discussed, we will start you on an antibiotic to see if this helps.   It was a pleasure to see you today!  Thank you for trusting me with your gastrointestinal care!

## 2024-05-06 ENCOUNTER — Ambulatory Visit: Admitting: Physician Assistant
